# Patient Record
Sex: Male | Born: 1935 | Race: White | Hispanic: No | Marital: Married | Smoking: Former smoker
Health system: Southern US, Community
[De-identification: ages and names within clinical notes are randomized; demographics above are authoritative.]

## PROBLEM LIST (undated history)

## (undated) DIAGNOSIS — Z8673 Personal history of transient ischemic attack (TIA), and cerebral infarction without residual deficits: Secondary | ICD-10-CM

## (undated) DIAGNOSIS — R29818 Other symptoms and signs involving the nervous system: Secondary | ICD-10-CM

## (undated) DIAGNOSIS — R278 Other lack of coordination: Secondary | ICD-10-CM

## (undated) DIAGNOSIS — N400 Enlarged prostate without lower urinary tract symptoms: Secondary | ICD-10-CM

## (undated) DIAGNOSIS — I10 Essential (primary) hypertension: Secondary | ICD-10-CM

## (undated) DIAGNOSIS — R29898 Other symptoms and signs involving the musculoskeletal system: Secondary | ICD-10-CM

## (undated) DIAGNOSIS — Z87442 Personal history of urinary calculi: Secondary | ICD-10-CM

## (undated) DIAGNOSIS — G629 Polyneuropathy, unspecified: Secondary | ICD-10-CM

## (undated) HISTORY — PX: OTHER SURGICAL HISTORY: SHX169

## (undated) HISTORY — PX: HERNIA REPAIR: SHX51

## (undated) HISTORY — PX: TONSILLECTOMY: SUR1361

---

## 2004-09-11 ENCOUNTER — Inpatient Hospital Stay: Payer: Self-pay | Admitting: Internal Medicine

## 2007-05-22 ENCOUNTER — Ambulatory Visit: Payer: Self-pay | Admitting: Internal Medicine

## 2007-05-27 ENCOUNTER — Ambulatory Visit: Payer: Self-pay | Admitting: Internal Medicine

## 2016-10-30 ENCOUNTER — Other Ambulatory Visit: Payer: Self-pay | Admitting: Internal Medicine

## 2016-10-30 DIAGNOSIS — M545 Low back pain: Secondary | ICD-10-CM

## 2016-11-28 ENCOUNTER — Ambulatory Visit
Admission: RE | Admit: 2016-11-28 | Discharge: 2016-11-28 | Disposition: A | Discharge status: Home / Self Care | Payer: Medicare Other | Source: Ambulatory Visit | Attending: Internal Medicine | Admitting: Internal Medicine

## 2016-11-28 DIAGNOSIS — M47896 Other spondylosis, lumbar region: Secondary | ICD-10-CM | POA: Insufficient documentation

## 2016-11-28 DIAGNOSIS — M48061 Spinal stenosis, lumbar region without neurogenic claudication: Secondary | ICD-10-CM | POA: Diagnosis not present

## 2016-11-28 DIAGNOSIS — I10 Essential (primary) hypertension: Secondary | ICD-10-CM | POA: Insufficient documentation

## 2016-11-28 DIAGNOSIS — N401 Enlarged prostate with lower urinary tract symptoms: Secondary | ICD-10-CM | POA: Diagnosis not present

## 2016-11-28 DIAGNOSIS — M545 Low back pain: Secondary | ICD-10-CM | POA: Diagnosis present

## 2016-11-28 DIAGNOSIS — R2681 Unsteadiness on feet: Secondary | ICD-10-CM | POA: Diagnosis not present

## 2016-11-28 DIAGNOSIS — M5126 Other intervertebral disc displacement, lumbar region: Secondary | ICD-10-CM | POA: Diagnosis not present

## 2016-11-28 DIAGNOSIS — G8929 Other chronic pain: Secondary | ICD-10-CM | POA: Diagnosis not present

## 2016-11-28 MED ORDER — GADOBENATE DIMEGLUMINE 529 MG/ML IV SOLN
15.0000 mL | Freq: Once | INTRAVENOUS | Status: AC | PRN
  Administered 2016-11-28: 15 mL via INTRAVENOUS

## 2018-04-13 IMAGING — MR MR LUMBAR SPINE WO/W CM
4 of 7 series · 14 of 48 positions shown · IV contrast (15ML MULTIHANCE)
Comparison: CT of the abdomen and pelvis 05/27/2007

CLINICAL DATA: Low back pain extending into the hips and buttocks
bilaterally. Low back pain, unspecified back pain laterality,
unspecified chronicity, with sciatica presence unspecified.

EXAM:
MRI LUMBAR SPINE WITHOUT AND WITH CONTRAST
TECHNIQUE: Multiplanar and multiecho pulse sequences of the lumbar spine were
obtained without and with intravenous contrast.
CONTRAST:  15mL MULTIHANCE GADOBENATE DIMEGLUMINE 529 MG/ML IV SOLN

[Series 2: T2 · sagittal · 4.0mm · 0.44mm/px · 5 of 19 slices shown (1 of 2)]
[im 1/19]
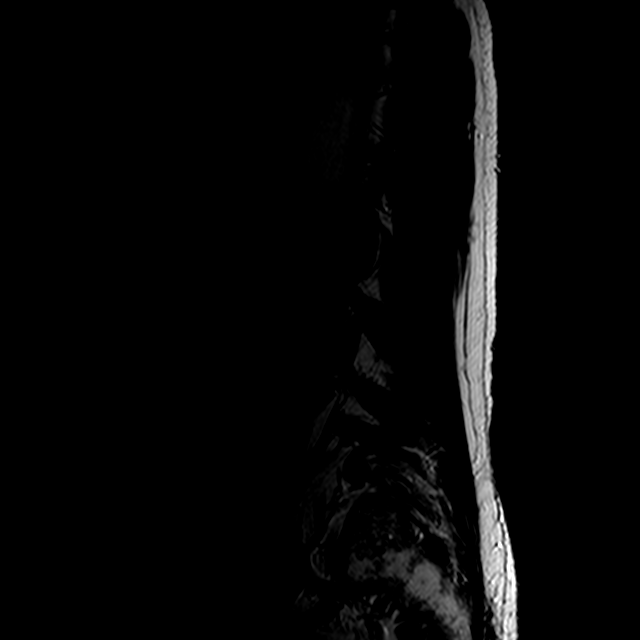
[im 4/19]
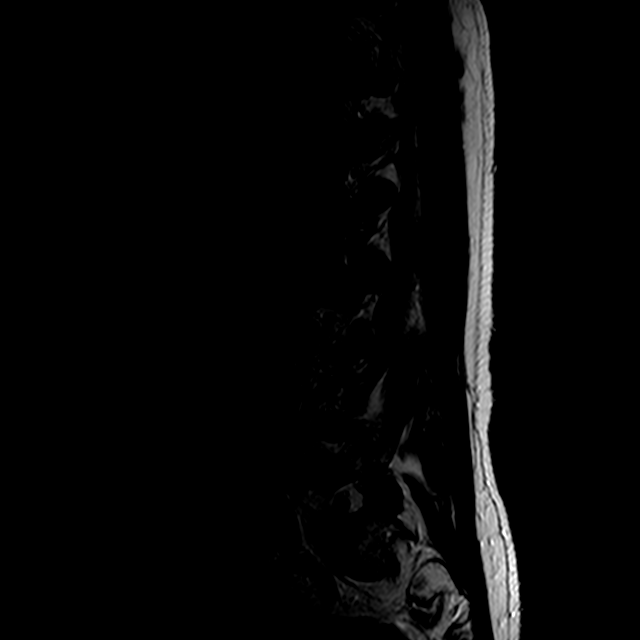
[im 8/19]
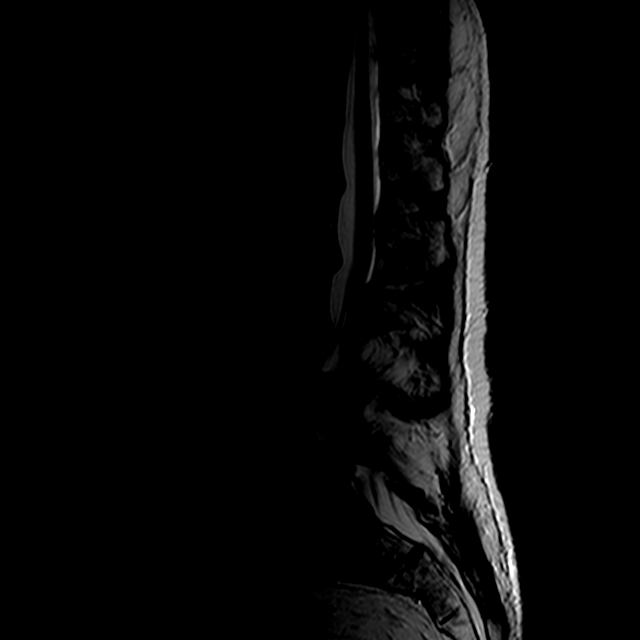
[im 11/19]
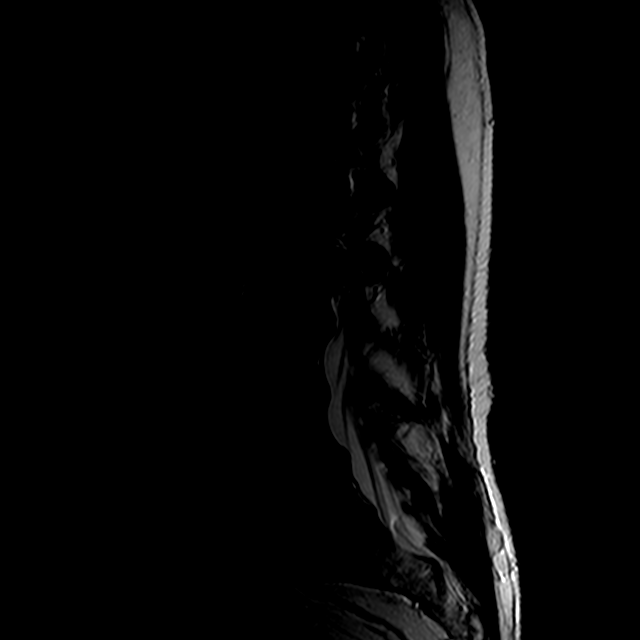
[im 19/19]
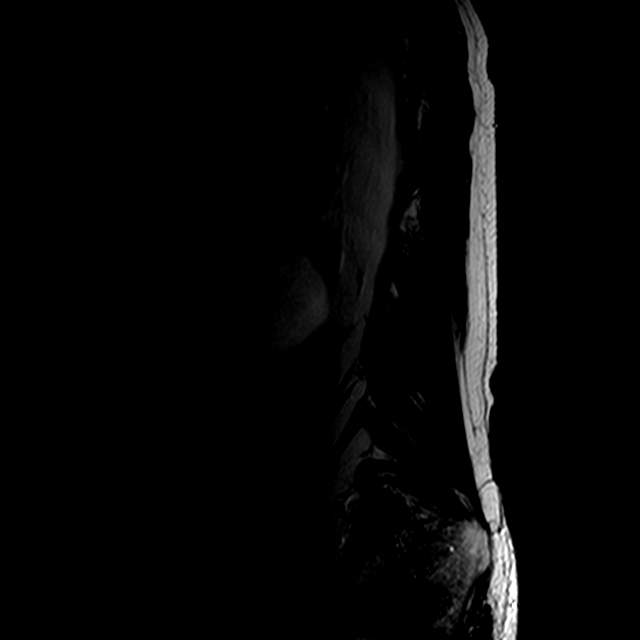

[Series 3: T1 · sagittal · 4.0mm · 0.44mm/px · 3 of 19 slices shown (1 of 2)]
[im 1/19]
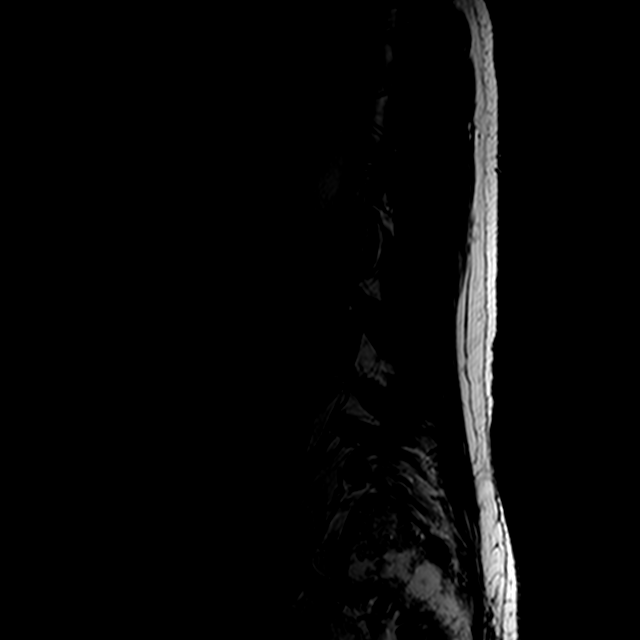
[im 10/19]
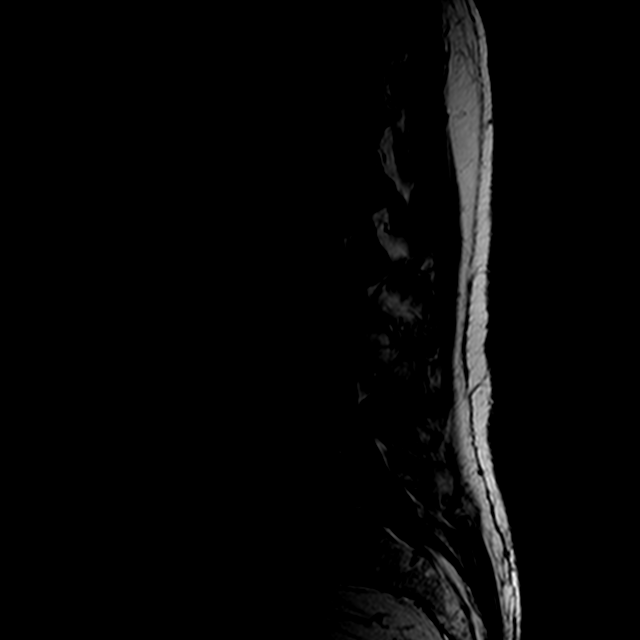
[im 19/19]
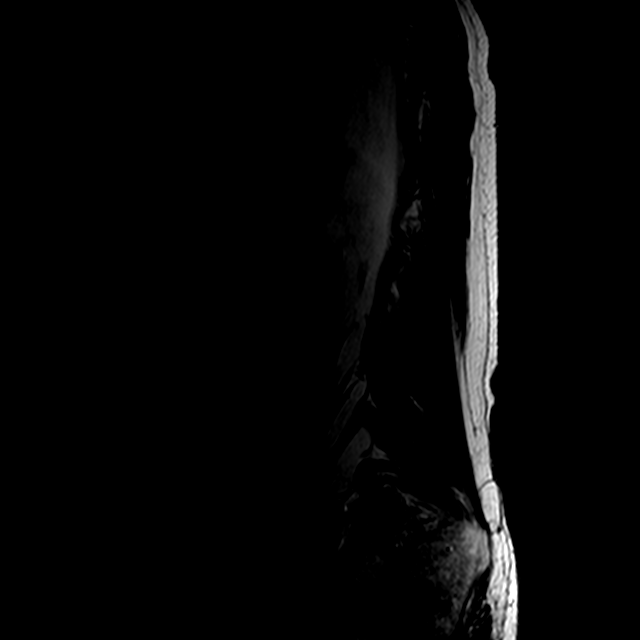

[Series 5: T2 · axial · 4.0mm · 0.39mm/px · z∈[-76,+80]mm · 3 of 36 slices shown (2 of 2)]
[im 5/36]
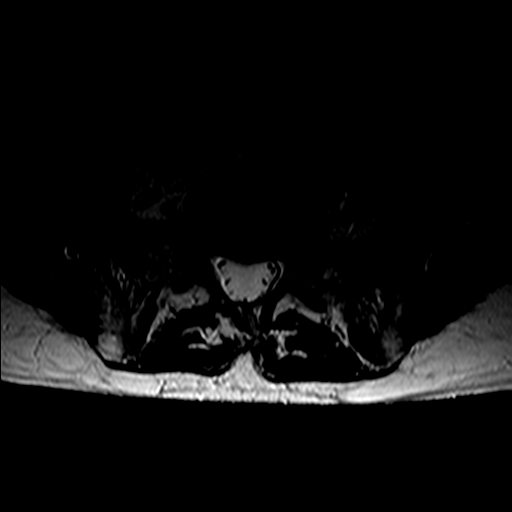
[im 18/36]
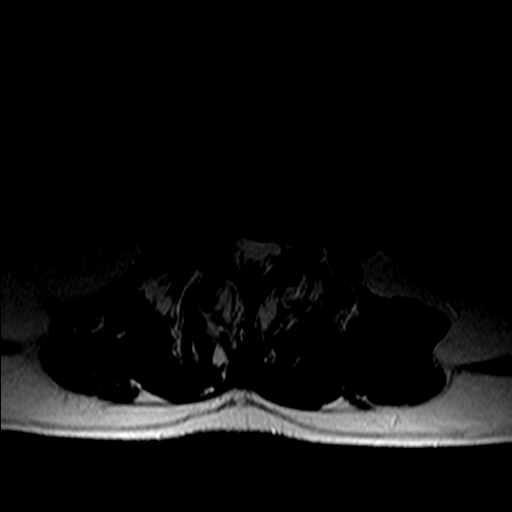
[im 31/36]
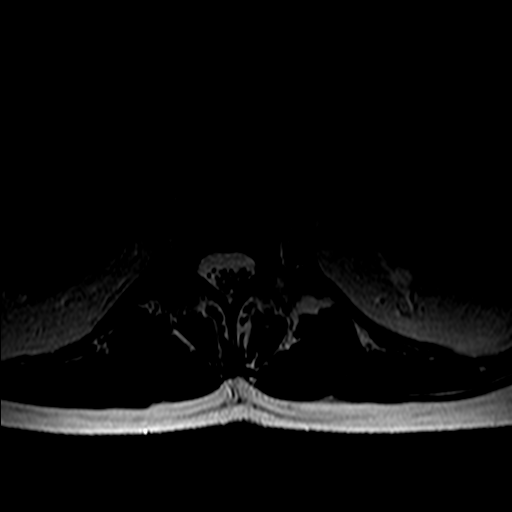

[Series 6: T1 · axial · 4.0mm · 0.39mm/px · z∈[-76,+80]mm · 3 of 36 slices shown (2 of 2)]
[im 5/36]
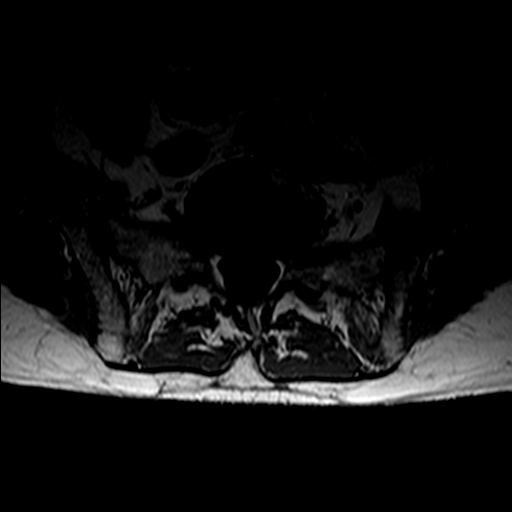
[im 18/36]
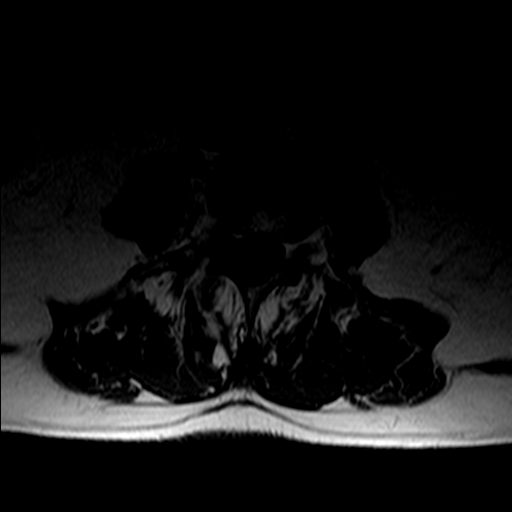
[im 31/36]
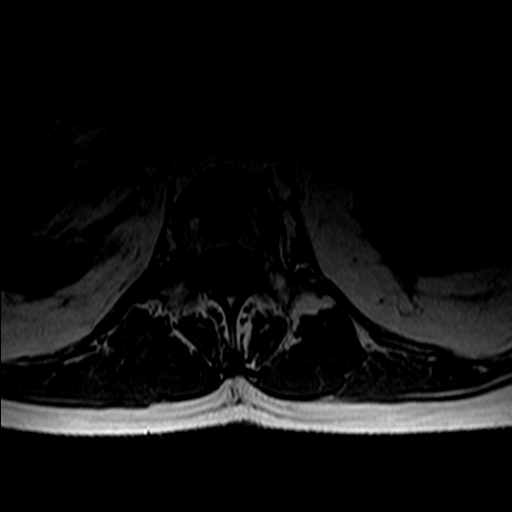

[14 of 48 positions shown; findings below may reference images not displayed]

FINDINGS: Segmentation: 5 non rib-bearing lumbar type vertebral bodies are
present.

Alignment: Slight retrolisthesis is present at L1-2 at L2-3. Minimal
anterolisthesis is present at L4-5. Levoconvex curvature of the
lumbar spine is centered at L3-4. Rightward curvature is present at
L1.

Vertebrae: Chronic endplate marrow changes are present on the left
at L1-2 and on the right at L2-3. Endplate marrow changes are
present on the right at L3-4 and L4-5. Marrow signal is somewhat
heterogeneous throughout. There is some enhancement of the endplates
at L2-3, or acute/ subacute inflammatory change without evidence for
infection.

Conus medullaris: Extends to the T12-L1 level and appears normal.

Paraspinal and other soft tissues: The left renal cysts and a extra
renal collecting system are again noted. Dilation of the extrarenal
collecting system is increased. No other focal parenchymal lesions
are present. There is no significant adenopathy.

Disc levels:

T12-L1:  Leftward disc bulging is present without focal stenosis.

L1-2: A leftward disc protrusion is present. Mild facet hypertrophy
is worse on the left. Mild left subarticular and foraminal narrowing
is present.

L2-3: A broad-based disc protrusion is present. Mild facet
hypertrophy is noted bilaterally. Mild subarticular and bilateral
foraminal stenosis is present.

L3-4: A broad-based disc protrusion is present. Asymmetric
right-sided facet hypertrophy and spurring is noted. Moderate right
and mild left subarticular and foraminal stenosis is evident.

L4-5: A rightward disc protrusion is present. Asymmetric right-sided
facet hypertrophy and spurring is noted. Moderate right and mild
left subarticular and foraminal narrowing is present.

L5-S1: A shallow central disc bulge is present without significant
stenosis.
IMPRESSION: 1. Multilevel spondylosis of the lumbar spine and levoconvex
curvature, centered at L3-4.
2. Mild left subarticular and foraminal narrowing at L1-2.
3. Mild subarticular and foraminal stenosis bilaterally at L2-3.
Endplate changes are most acute at L2-3, right greater than left.
4. Moderate right and mild left subarticular and foraminal stenosis
at L3-4 and L4-5 secondary to rightward disc protrusions and
asymmetric facet hypertrophy and spurring.

## 2020-03-17 ENCOUNTER — Other Ambulatory Visit: Payer: Self-pay | Admitting: Urology

## 2020-03-17 DIAGNOSIS — R972 Elevated prostate specific antigen [PSA]: Secondary | ICD-10-CM

## 2020-04-03 ENCOUNTER — Ambulatory Visit
Admission: RE | Admit: 2020-04-03 | Discharge: 2020-04-03 | Disposition: A | Discharge status: Home / Self Care | Payer: Medicare Other | Source: Ambulatory Visit | Attending: Urology | Admitting: Urology

## 2020-04-03 ENCOUNTER — Other Ambulatory Visit: Payer: Self-pay

## 2020-04-03 DIAGNOSIS — R972 Elevated prostate specific antigen [PSA]: Secondary | ICD-10-CM | POA: Diagnosis present

## 2020-04-03 MED ORDER — GADOBUTROL 1 MMOL/ML IV SOLN
7.0000 mL | Freq: Once | INTRAVENOUS | Status: AC | PRN
  Administered 2020-04-03: 7 mL via INTRAVENOUS

## 2020-04-16 NOTE — H&P (Signed)
NAME: James Herrera, James Herrera MEDICAL RECORD IT:[id number] ACCOUNT [id number] DATE OF BIRTH:1933/12/21 FACILITY: [facility] LOCATION: [facility] PHYSICIAN:Seth Friedlander Farrel Conners, MD  HISTORY AND PHYSICAL  DATE OF ADMISSION:  04/22/2020  CHIEF COMPLAINT:  Elevated PSA.  HISTORY OF PRESENT ILLNESS:  The patient is an 86 year old white male with long history of BPH and LUTS that is currently being managed with finasteride.  His PSA level increased up to 6.9 ng/mL on 09/01 prompting MRI scan.  Prostate MRI scan report  dated 10/01indicated a BI-RADS category 4 lesion involving the right base.  He comes in now for UroNav fusion biopsy of the prostate.  Prostate volume at the time of the MRI was 16 mL.  The patient did have a negative biopsy back in 2011.  PAST MEDICAL HISTORY:    ALLERGIES:  No drug allergies.  CURRENT MEDICATIONS:  Included olmesartan, amlodipine, metoprolol, finasteride, multivitamins, vitamin C, Caltrate with D, glucosamine chondroitin, vitamin B12, potassium, aspirin, fish oil, zinc and Allergy Relief.  PAST SURGICAL HISTORY: 1.  Tonsillectomy in 1940s. 2.  Umbilical herniorrhaphy in 1967. 3.  Right inguinal herniorrhaphy in 1979. 4.  Left inguinal herniorrhaphy in 1980. 5.  Left kidney stone basket extraction in 1987. 6.  Ureteroscopic stone extraction in 2003.  PAST AND CURRENT MEDICAL CONDITIONS:    1.  Hypertension. 2.  BPH.  REVIEW OF SYSTEMS:  The patient denies chest pain, shortness of breath, diabetes, stroke or heart disease.  SOCIAL HISTORY:  The patient is a retired Emergency planning/management officer.  He denied tobacco or alcohol use.  FAMILY HISTORY:  Positive for hypertension.  FAMILY HISTORY:  Negative for prostate cancer.  PHYSICAL EXAMINATION: VITAL SIGNS:  Height 5 feet 4, weight 154. GENERAL:  Well-nourished, white male in no acute distress. HEENT:  Sclerae were clear.  Pupils are equally round, reactive to light and accommodation.  Extraocular movements were  intact. NECK:  No palpable cervical adenopathy. PULMONARY:  Lungs clear to auscultation. CARDIOVASCULAR:  Regular rhythm and rate without audible murmurs. ABDOMEN:  Soft, nontender abdomen. GENITOURINARY:  Uncircumcised.  Testes were smooth, nontender, 20 mL size each. RECTAL:  40 g prostate with TURP defect and firm rim. NEUROMUSCULAR:  Alert and oriented x3.  IMPRESSION: 1.  Elevated PSA. 2.  BPH. 3.  Abnormal prostate MRI.  PLAN:  UroNav fusion biopsy of the prostate.  CN/NUANCE  D:04/15/2020 T:04/15/2020 JOB:[id number]

## 2020-04-19 ENCOUNTER — Other Ambulatory Visit: Payer: Self-pay

## 2020-04-19 ENCOUNTER — Encounter
Admission: RE | Admit: 2020-04-19 | Discharge: 2020-04-19 | Disposition: A | Discharge status: Home / Self Care | Payer: Medicare Other | Source: Ambulatory Visit | Attending: Urology | Admitting: Urology

## 2020-04-19 HISTORY — DX: Essential (primary) hypertension: I10

## 2020-04-19 HISTORY — DX: Personal history of urinary calculi: Z87.442

## 2020-04-19 HISTORY — DX: Benign prostatic hyperplasia without lower urinary tract symptoms: N40.0

## 2020-04-19 NOTE — Patient Instructions (Signed)
Your procedure is scheduled on: April 22, 2020  Thursday  Report to Day Surgery on the 2nd floor of the [facility]. To find out your arrival time, please call [phone] between 1PM - 3PM on: April 21, 2020 [facility]  REMEMBER: Instructions that are not followed completely may result in serious medical risk, up to and including death; or upon the discretion of your surgeon and anesthesiologist your surgery may need to be rescheduled.  Do not eat food after midnight the night before surgery.  No gum chewing, lozengers or hard candies.  You may however, drink CLEAR liquids up to 2 hours before you are scheduled to arrive for your surgery. Do not drink anything within 2 hours of your scheduled arrival time.  Clear liquids include: - water  - apple juice without pulp - gatorade (not RED) - black coffee or tea (Do NOT add milk or creamers to the coffee or tea) Do NOT drink anything that is not on this list.  Type 1 and Type 2 diabetics should only drink water.  TAKE THESE MEDICATIONS THE MORNING OF SURGERY WITH A SIP OF WATER: METOPROLOL  One week prior to surgery: Stop Anti-inflammatories (NSAIDS) such as Advil, Aleve, Ibuprofen, Motrin, Naproxen, Naprosyn and ASPIRIN OR Aspirin based products such as Excedrin, Goodys Powder, BC Powder. STOP OMEGA -3 Stop ANY OVER THE COUNTER supplements until after surgery. (You may continue taking Tylenol, Vitamin D, Vitamin B, and multivitamin.)  No Alcohol for 24 hours before or after surgery.  No Smoking including e-cigarettes for 24 hours prior to surgery.  No chewable tobacco products for at least 6 hours prior to surgery.  No nicotine patches on the day of surgery.  Do not use any "recreational" drugs for at least a week prior to your surgery.  Please be advised that the combination of cocaine and anesthesia may have negative outcomes, up to and including death. If you test positive for cocaine, your surgery will be cancelled.  On  the morning of surgery brush your teeth with toothpaste and water, you may rinse your mouth with mouthwash if you wish. Do not swallow any toothpaste or mouthwash.  Do not wear jewelry, make-up, hairpins, clips or nail polish.  Do not wear lotions, powders, or perfumes AFTERSHAVE OR DEODORANT   Do not shave 48 hours prior to surgery.   Contact lenses, hearing aids and dentures may not be worn into surgery.  Do not bring valuables to the hospital. [facility] is not responsible for any missing/lost belongings or valuables.   TAKE A SHOWER MORNING OF SURGERY.  Fleets enema  as directed.  Notify your doctor if there is any change in your medical condition (cold, fever, infection).  Wear comfortable clothing (specific to your surgery type) to the hospital.  Plan for stool softeners for home use; pain medications have a tendency to cause constipation. You can also help prevent constipation by eating foods high in fiber such as fruits and vegetables and drinking plenty of fluids as your diet allows.  After surgery, you can help prevent lung complications by doing breathing exercises.  Take deep breaths and cough every 1-2 hours. Your doctor may order a device called an Incentive Spirometer to help you take deep breaths. When coughing or sneezing, hold a pillow firmly against your incision with both hands. This is called "splinting." Doing this helps protect your incision. It also decreases belly discomfort.  If you are being discharged the day of surgery, you will not be allowed  to drive home. You will need a responsible adult (18 years or older) to drive you home and stay with you that night.   Please call the [place] Dept. at [phone] if you have any questions about these instructions.  Visitation Policy:  Patients undergoing a surgery or procedure may have one family member or support person with them as long as that person is not COVID-19 positive or experiencing  its symptoms.  That person may remain in the waiting area during the procedure.  Inpatient Visitation Update:   In an effort to ensure the safety of our team members and our patients, we are implementing a change to our visitation policy:  Effective Monday, Aug. 9, at 7 a.m., inpatients will be allowed one support person.  o The support person may change daily.  o The support person must pass our screening, gel in and out, and wear a mask at all times, including in the patient's room.  o Patients must also wear a mask when staff or their support person are in the room.  o Masking is required regardless of vaccination status.  Systemwide, no visitors 17 or younger.

## 2020-04-20 ENCOUNTER — Encounter
Admission: RE | Admit: 2020-04-20 | Discharge: 2020-04-20 | Disposition: A | Discharge status: Home / Self Care | Payer: Medicare Other | Source: Ambulatory Visit | Attending: Urology | Admitting: Urology

## 2020-04-20 DIAGNOSIS — I10 Essential (primary) hypertension: Secondary | ICD-10-CM | POA: Diagnosis not present

## 2020-04-20 DIAGNOSIS — Z20822 Contact with and (suspected) exposure to covid-19: Secondary | ICD-10-CM | POA: Insufficient documentation

## 2020-04-20 DIAGNOSIS — Z01812 Encounter for preprocedural laboratory examination: Secondary | ICD-10-CM | POA: Insufficient documentation

## 2020-04-20 DIAGNOSIS — Z01810 Encounter for preprocedural cardiovascular examination: Secondary | ICD-10-CM | POA: Insufficient documentation

## 2020-04-20 LAB — POTASSIUM: Potassium: 3.4 mmol/L — ABNORMAL LOW (ref 3.5–5.1)

## 2020-04-21 LAB — SARS CORONAVIRUS 2 (TAT 6-24 HRS): SARS Coronavirus 2: NEGATIVE

## 2020-04-21 MED ORDER — GENTAMICIN IN SALINE 0.8-0.9 MG/ML-% IV SOLN
80.0000 mg | INTRAVENOUS | Status: AC
  Administered 2020-04-22: 80 mg via INTRAVENOUS
  Filled 2020-04-21: qty 100

## 2020-04-22 ENCOUNTER — Ambulatory Visit
Admission: RE | Admit: 2020-04-22 | Discharge: 2020-04-22 | Disposition: A | Discharge status: Home / Self Care | Payer: Medicare Other | Attending: Urology | Admitting: Urology

## 2020-04-22 ENCOUNTER — Encounter: Payer: Self-pay | Admitting: Urology

## 2020-04-22 ENCOUNTER — Other Ambulatory Visit: Payer: Self-pay

## 2020-04-22 ENCOUNTER — Ambulatory Visit: Payer: Medicare Other | Admitting: Anesthesiology

## 2020-04-22 ENCOUNTER — Encounter
Admission: RE | Disposition: A | Discharge status: Home / Self Care | Payer: Self-pay | Source: Home / Self Care | Attending: Urology

## 2020-04-22 DIAGNOSIS — Z87891 Personal history of nicotine dependence: Secondary | ICD-10-CM | POA: Insufficient documentation

## 2020-04-22 DIAGNOSIS — N401 Enlarged prostate with lower urinary tract symptoms: Secondary | ICD-10-CM | POA: Diagnosis not present

## 2020-04-22 DIAGNOSIS — I10 Essential (primary) hypertension: Secondary | ICD-10-CM | POA: Insufficient documentation

## 2020-04-22 DIAGNOSIS — Z7982 Long term (current) use of aspirin: Secondary | ICD-10-CM | POA: Diagnosis not present

## 2020-04-22 DIAGNOSIS — C61 Malignant neoplasm of prostate: Secondary | ICD-10-CM | POA: Insufficient documentation

## 2020-04-22 DIAGNOSIS — N4289 Other specified disorders of prostate: Secondary | ICD-10-CM | POA: Diagnosis present

## 2020-04-22 DIAGNOSIS — Z79899 Other long term (current) drug therapy: Secondary | ICD-10-CM | POA: Insufficient documentation

## 2020-04-22 HISTORY — PX: PROSTATE BIOPSY: SHX241

## 2020-04-22 SURGERY — BIOPSY, PROSTATE
Anesthesia: General

## 2020-04-22 MED ORDER — CEFAZOLIN SODIUM-DEXTROSE 1-4 GM/50ML-% IV SOLN
INTRAVENOUS | Status: AC
  Filled 2020-04-22: qty 50

## 2020-04-22 MED ORDER — PHENYLEPHRINE HCL (PRESSORS) 10 MG/ML IV SOLN
INTRAVENOUS | Status: DC | PRN
  Administered 2020-04-22: 100 ug via INTRAVENOUS

## 2020-04-22 MED ORDER — ONDANSETRON HCL 4 MG/2ML IJ SOLN
INTRAMUSCULAR | Status: AC
  Filled 2020-04-22: qty 2

## 2020-04-22 MED ORDER — LEVOFLOXACIN 500 MG PO TABS: 500.0000 mg | ORAL_TABLET | Freq: Every day | ORAL | 0 refills | Status: DC

## 2020-04-22 MED ORDER — FENTANYL CITRATE (PF) 100 MCG/2ML IJ SOLN
INTRAMUSCULAR | Status: AC
  Filled 2020-04-22: qty 2

## 2020-04-22 MED ORDER — ORAL CARE MOUTH RINSE: 15.0000 mL | Freq: Once | OROMUCOSAL | Status: AC

## 2020-04-22 MED ORDER — ONDANSETRON HCL 4 MG/2ML IJ SOLN: 4.0000 mg | Freq: Once | INTRAMUSCULAR | Status: DC | PRN

## 2020-04-22 MED ORDER — ONDANSETRON HCL 4 MG/2ML IJ SOLN
INTRAMUSCULAR | Status: DC | PRN
  Administered 2020-04-22: 4 mg via INTRAVENOUS

## 2020-04-22 MED ORDER — FLEET ENEMA 7-19 GM/118ML RE ENEM: 1.0000 | ENEMA | Freq: Once | RECTAL | Status: DC

## 2020-04-22 MED ORDER — LIDOCAINE HCL (CARDIAC) PF 100 MG/5ML IV SOSY
PREFILLED_SYRINGE | INTRAVENOUS | Status: DC | PRN
  Administered 2020-04-22: 50 mg via INTRAVENOUS

## 2020-04-22 MED ORDER — FENTANYL CITRATE (PF) 100 MCG/2ML IJ SOLN: 25.0000 ug | INTRAMUSCULAR | Status: DC | PRN

## 2020-04-22 MED ORDER — LIDOCAINE HCL (PF) 2 % IJ SOLN
INTRAMUSCULAR | Status: AC
  Filled 2020-04-22: qty 5

## 2020-04-22 MED ORDER — CHLORHEXIDINE GLUCONATE 0.12 % MT SOLN: 15.0000 mL | Freq: Once | OROMUCOSAL | Status: AC

## 2020-04-22 MED ORDER — EPHEDRINE 5 MG/ML INJ
INTRAVENOUS | Status: AC
  Filled 2020-04-22: qty 10

## 2020-04-22 MED ORDER — FAMOTIDINE 20 MG PO TABS
ORAL_TABLET | ORAL | Status: AC
  Administered 2020-04-22: 20 mg via ORAL
  Filled 2020-04-22: qty 1

## 2020-04-22 MED ORDER — CEFAZOLIN SODIUM-DEXTROSE 1-4 GM/50ML-% IV SOLN
1.0000 g | Freq: Once | INTRAVENOUS | Status: AC
  Administered 2020-04-22: 1 g via INTRAVENOUS

## 2020-04-22 MED ORDER — LACTATED RINGERS IV SOLN: INTRAVENOUS | Status: DC

## 2020-04-22 MED ORDER — GLYCOPYRROLATE 0.2 MG/ML IJ SOLN
INTRAMUSCULAR | Status: AC
  Filled 2020-04-22: qty 1

## 2020-04-22 MED ORDER — FAMOTIDINE 20 MG PO TABS: 20.0000 mg | ORAL_TABLET | Freq: Once | ORAL | Status: AC

## 2020-04-22 MED ORDER — PROPOFOL 10 MG/ML IV BOLUS
INTRAVENOUS | Status: DC | PRN
  Administered 2020-04-22: 150 mg via INTRAVENOUS

## 2020-04-22 MED ORDER — GLYCOPYRROLATE 0.2 MG/ML IJ SOLN
INTRAMUSCULAR | Status: DC | PRN
  Administered 2020-04-22: .2 mg via INTRAVENOUS

## 2020-04-22 MED ORDER — FENTANYL CITRATE (PF) 100 MCG/2ML IJ SOLN
INTRAMUSCULAR | Status: DC | PRN
Start: 2020-04-22 — End: 2020-04-22
  Administered 2020-04-22 (×2): 25 ug via INTRAVENOUS
  Administered 2020-04-22: 50 ug via INTRAVENOUS

## 2020-04-22 MED ORDER — CHLORHEXIDINE GLUCONATE 0.12 % MT SOLN
OROMUCOSAL | Status: AC
  Administered 2020-04-22: 15 mL via OROMUCOSAL
  Filled 2020-04-22: qty 15

## 2020-04-22 MED ORDER — DEXAMETHASONE SODIUM PHOSPHATE 10 MG/ML IJ SOLN
INTRAMUSCULAR | Status: AC
  Filled 2020-04-22: qty 1

## 2020-04-22 MED ORDER — DEXAMETHASONE SODIUM PHOSPHATE 10 MG/ML IJ SOLN
INTRAMUSCULAR | Status: DC | PRN
  Administered 2020-04-22: 5 mg via INTRAVENOUS

## 2020-04-22 SURGICAL SUPPLY — 18 items
COVER MAYO STAND REUSABLE (DRAPES) ×3 IMPLANT
COVER TRANSDUCER ULTRASOUND (MISCELLANEOUS) ×2 IMPLANT
GLOVE BIO SURGEON STRL SZ7 (GLOVE) ×6 IMPLANT
GUIDE NDL ENDOCAV 16-18 CVR (NEEDLE) IMPLANT
GUIDE NDL URONAV ULTRASND S (MISCELLANEOUS) IMPLANT
GUIDE NEEDLE ENDOCAV 16-18 CVR (NEEDLE) IMPLANT
GUIDE NEEDLE URONAV ULTRASND S (MISCELLANEOUS) ×1 IMPLANT
INST BIOPSY MAXCORE 18GX25 (NEEDLE) ×3 IMPLANT
NDL GUIDE BIOPSY 644068 (NEEDLE) IMPLANT
NEEDLE GUIDE BIOPSY 644068 (NEEDLE) IMPLANT
PROBE URONAV BK 8808E 8818 HLD (MISCELLANEOUS) IMPLANT
STRAP SAFETY BODY (MISCELLANEOUS) ×3 IMPLANT
SURGILUBE 2OZ TUBE FLIPTOP (MISCELLANEOUS) ×3 IMPLANT
TOWEL OR 17X26 4PK STRL BLUE (TOWEL DISPOSABLE) ×3 IMPLANT
URONAV BK 8808E 8818 PROBE HLD (MISCELLANEOUS) ×3
URONAV MRI FUSION TWO PATIENTS (MISCELLANEOUS) ×2 IMPLANT
URONAV ULTRASOUND (MISCELLANEOUS) ×2 IMPLANT
URONAV ULTRASOUND NDL GUIDE S (MISCELLANEOUS) ×3

## 2020-04-22 NOTE — Transfer of Care (Signed)
Immediate Anesthesia Transfer of Care Note  Patient: James Herrera.  Procedure(s) Performed: PROSTATE BIOPSY URONAV (N/A )  Patient Location: PACU  Anesthesia Type:General  Level of Consciousness: drowsy  Airway & Oxygen Therapy: Patient Spontanous Breathing and Patient connected to face mask oxygen  Post-op Assessment: Report given to RN  Post vital signs: stable  Last Vitals:  Vitals Value Taken Time  BP    Temp    Pulse    Resp    SpO2      Last Pain:  Vitals:   04/22/20 1159  TempSrc: Temporal  PainSc: 0-No pain         Complications: No complications documented.

## 2020-04-22 NOTE — Anesthesia Preprocedure Evaluation (Signed)
Anesthesia Evaluation  Patient identified by MRN, date of birth, ID band Patient awake    Reviewed: Allergy & Precautions, NPO status , Patient's Chart, lab work & pertinent test results  History of Anesthesia Complications Negative for: history of anesthetic complications  Airway Mallampati: II       Dental   Pulmonary neg sleep apnea, neg COPD, Not current smoker, former smoker,           Cardiovascular hypertension, Pt. on medications (-) Past MI and (-) CHF (-) dysrhythmias (-) Valvular Problems/Murmurs     Neuro/Psych neg Seizures    GI/Hepatic Neg liver ROS, neg GERD  ,  Endo/Other  neg diabetes  Renal/GU negative Renal ROS     Musculoskeletal   Abdominal   Peds  Hematology   Anesthesia Other Findings   Reproductive/Obstetrics                             Anesthesia Physical Anesthesia Plan  ASA: II  Anesthesia Plan: General   Post-op Pain Management:    Induction: Intravenous  PONV Risk Score and Plan: 2 and Dexamethasone and Ondansetron  Airway Management Planned: LMA  Additional Equipment:   Intra-op Plan:   Post-operative Plan:   Informed Consent: I have reviewed the patients History and Physical, chart, labs and discussed the procedure including the risks, benefits and alternatives for the proposed anesthesia with the patient or authorized representative who has indicated his/her understanding and acceptance.       Plan Discussed with:   Anesthesia Plan Comments:         Anesthesia Quick Evaluation

## 2020-04-22 NOTE — Discharge Instructions (Signed)

## 2020-04-22 NOTE — Anesthesia Postprocedure Evaluation (Signed)
Anesthesia Post Note  Patient: James Herrera.  Procedure(s) Performed: PROSTATE BIOPSY URONAV (N/A )  Patient location during evaluation: PACU Anesthesia Type: General Level of consciousness: awake and alert Pain management: pain level controlled Vital Signs Assessment: post-procedure vital signs reviewed and stable Respiratory status: spontaneous breathing and respiratory function stable Cardiovascular status: stable Anesthetic complications: no   No complications documented.   Last Vitals:  Vitals:   04/22/20 1420 04/22/20 1435  BP: (!) 156/85 137/83  Pulse: 66 70  Resp: 15 17  Temp: 36.6 C   SpO2: 100% 96%    Last Pain:  Vitals:   04/22/20 1420  TempSrc:   PainSc: Asleep                 Chosen Garron K

## 2020-04-22 NOTE — Op Note (Signed)
Preoperative diagnosis:  1.  Elevated PSA (C61)                                              2.  PIRAD category 4 lesion right base on MRI (D40.0) Postoperative diagnosis: Same  Procedure: Uronav fusion image guided prostate biopsy (CPT [id number], [id number])  Surgeon: Otelia Limes. Yves Dill MD  Anesthesia: General  Indications:See the history and physical. After informed consent the above procedure(s) were requested     Technique and findings: After adequate general anesthesia been obtained the patient was placed into left lateral decubitus position.  Digital inspection indicated that the rectal vault was clear.  The transrectal ultrasound probe was placed and prostate images acquired.  The ultrasound images were then fused with the MRI images.  The region of interest was identified and 4 core biopsies taken from this lesion.  At this point standard 12 core systematic biopsies were performed.  The ultrasound probe was removed.  Blood loss was minimal.  Procedure was then terminated and patient transferred to the recovery room in stable condition.

## 2020-04-22 NOTE — Anesthesia Procedure Notes (Signed)
Procedure Name: LMA Insertion Date/Time: 04/22/2020 1:49 PM Performed by: Lerry Liner, CRNA Pre-anesthesia Checklist: Patient identified, Emergency Drugs available, Suction available and Patient being monitored Patient Re-evaluated:Patient Re-evaluated prior to induction Oxygen Delivery Method: Circle system utilized Preoxygenation: Pre-oxygenation with 100% oxygen Induction Type: IV induction LMA: LMA inserted LMA Size: 4.5 Number of attempts: 1 Tube secured with: Tape Dental Injury: Teeth and Oropharynx as per pre-operative assessment

## 2020-04-22 NOTE — H&P (Signed)
Date of Initial H&P: 04/15/20 History reviewed, patient examined, no change in status, stable for surgery. 

## 2020-04-23 ENCOUNTER — Encounter: Payer: Self-pay | Admitting: Urology

## 2020-04-26 LAB — SURGICAL PATHOLOGY

## 2020-06-10 ENCOUNTER — Other Ambulatory Visit: Payer: Self-pay | Admitting: Radiology

## 2020-06-10 DIAGNOSIS — C61 Malignant neoplasm of prostate: Secondary | ICD-10-CM

## 2020-06-25 ENCOUNTER — Other Ambulatory Visit: Payer: Self-pay

## 2020-06-25 ENCOUNTER — Ambulatory Visit
Admission: RE | Admit: 2020-06-25 | Discharge: 2020-06-25 | Disposition: A | Discharge status: Home / Self Care | Payer: Medicare Other | Source: Ambulatory Visit | Attending: Radiology | Admitting: Radiology

## 2020-06-25 DIAGNOSIS — C61 Malignant neoplasm of prostate: Secondary | ICD-10-CM | POA: Diagnosis not present

## 2021-05-05 ENCOUNTER — Other Ambulatory Visit: Payer: Self-pay | Admitting: Internal Medicine

## 2021-05-05 DIAGNOSIS — H5319 Other subjective visual disturbances: Secondary | ICD-10-CM

## 2021-05-05 DIAGNOSIS — R29898 Other symptoms and signs involving the musculoskeletal system: Secondary | ICD-10-CM

## 2021-05-05 DIAGNOSIS — R278 Other lack of coordination: Secondary | ICD-10-CM

## 2021-05-18 ENCOUNTER — Other Ambulatory Visit: Payer: Medicare Other

## 2021-05-20 ENCOUNTER — Ambulatory Visit
Admission: RE | Admit: 2021-05-20 | Discharge: 2021-05-20 | Disposition: A | Discharge status: Home / Self Care | Payer: Medicare Other | Source: Ambulatory Visit | Attending: Internal Medicine | Admitting: Internal Medicine

## 2021-05-20 ENCOUNTER — Other Ambulatory Visit: Payer: Self-pay

## 2021-05-20 DIAGNOSIS — R278 Other lack of coordination: Secondary | ICD-10-CM | POA: Diagnosis present

## 2021-05-20 DIAGNOSIS — R29898 Other symptoms and signs involving the musculoskeletal system: Secondary | ICD-10-CM | POA: Insufficient documentation

## 2021-05-20 DIAGNOSIS — H5319 Other subjective visual disturbances: Secondary | ICD-10-CM | POA: Insufficient documentation

## 2022-01-18 ENCOUNTER — Other Ambulatory Visit: Payer: Self-pay | Admitting: Neurology

## 2022-01-18 DIAGNOSIS — I639 Cerebral infarction, unspecified: Secondary | ICD-10-CM

## 2022-01-27 ENCOUNTER — Ambulatory Visit
Admission: RE | Admit: 2022-01-27 | Discharge: 2022-01-27 | Disposition: A | Discharge status: Home / Self Care | Payer: Medicare Other | Source: Ambulatory Visit | Attending: Neurology | Admitting: Neurology

## 2022-01-27 DIAGNOSIS — I639 Cerebral infarction, unspecified: Secondary | ICD-10-CM | POA: Insufficient documentation

## 2022-04-12 ENCOUNTER — Ambulatory Visit: Payer: Medicare Other | Attending: Neurology | Admitting: Occupational Therapy

## 2022-04-12 DIAGNOSIS — R278 Other lack of coordination: Secondary | ICD-10-CM | POA: Insufficient documentation

## 2022-04-12 DIAGNOSIS — M6281 Muscle weakness (generalized): Secondary | ICD-10-CM | POA: Insufficient documentation

## 2022-04-12 NOTE — Therapy (Signed)
OUTPATIENT OCCUPATIONAL THERAPY NEURO EVALUATION  Patient Name: James Herrera. MRN: [id number] DOB:[birth year], 88 y.o., male Today's Date: 04/12/2022  PCP: Dr. Ginette Pitman REFERRING PROVIDER: Dr. Melrose Nakayama   OT End of Session - 04/12/22 1007     Visit Number 1    Number of Visits 24    Date for OT Re-Evaluation 07/05/22    OT Start Time 1000    OT Stop Time 1100    OT Time Calculation (min) 60 min    Activity Tolerance Patient tolerated treatment well    Behavior During Therapy WFL for tasks assessed/performed             Past Medical History:  Diagnosis Date   BPH (benign prostatic hyperplasia)    History of kidney stones    Hypertension    Past Surgical History:  Procedure Laterality Date   HERNIA REPAIR Right    HERNIA REPAIR Left    HERNIA REPAIR     umbilical   kidney stone removal      x2   PROSTATE BIOPSY N/A 04/22/2020   Procedure: PROSTATE BIOPSY [name];  Surgeon: Royston Cowper, MD;  Location: [facility] ORS;  Service: Urology;  Laterality: N/A;   TONSILLECTOMY     There are no problems to display for this patient.   ONSET DATE: 01/08/2020  REFERRING DIAG: LUE weakness   THERAPY DIAG:   Left upper extremity weakness  Rationale for Evaluation and Treatment Rehabilitation  SUBJECTIVE:   SUBJECTIVE STATEMENT:  Patient is present with his wife initial evaluation  Pt accompanied by: self and significant other  PERTINENT HISTORY: Patient is an 88 year old male who was referred to OT services by Dr. 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  Patient reports left upper extremity changes occurred after receiving the COVID shot in 01/27/2020  PRECAUTIONS: None  WEIGHT BEARING RESTRICTIONS   No  PAIN:  Are you having pain? 1/10 in left thumb  FALLS: Has patient fallen in last 6 months? Yes, I fall off of a stool  LIVING ENVIRONMENT: Lives with: lives with their spouse Lives in: House/apartment Stairs: 1 story home, 2 steps from [place] Has following equipment at home: Single point cane and Grab bars  PLOF: Independent  PATIENT GOALS  To make it  to 100  OBJECTIVE:   HAND DOMINANCE: Left  ADLs: Overall ADLs:  Transfers/ambulation related to ADLs:Independent Eating:  Shakiness with utensils, some spillage, wife assists with  cutting food Grooming:  Awkward to use the left hand for brushing teeth, toothbrush turns in his mouth UB Dressing: Independent, wife assists with buttoning LB Dressing: Independent; difficulty tying shoes Toileting:  Independent Bathing:  Difficulty holding on to soap Tub Shower transfers:  Walk-in shower. Independent. Wife reports being present in the house, however does not supervise showers.  Equipment: Long handled shoe horn   IADLs: Shopping:  The transaction is a challenge Light housekeeping:  Mostly uses the right for the dishes,  Independent laundry, mowing, picking up sticks Meal Prep:  Difficulty with opening packets, packages, and zip lock bags Community mobility:  Driving Medication management: Independent using a pillbox. Difficulty grasping small pills with the left hand Financial management:  Wife performs Handwriting: 25% legible  MOBILITY STATUS: Independent  POSTURE COMMENTS:  Sitting balance: Independent  ACTIVITY TOLERANCE: Activity tolerance: Within functional limits  FUNCTIONAL OUTCOME MEASURES: FOTO: 63 TR score: 63  UPPER EXTREMITY ROM                BUEs WFL   UPPER EXTREMITY MMT:     MMT Right eval Left eval  Shoulder flexion 5/5 5/5  Shoulder abduction 5/5 5/5  Shoulder adduction    Shoulder extension    Shoulder internal rotation    Shoulder external rotation    Middle trapezius     Lower trapezius    Elbow flexion 5/5 5/5  Elbow extension 5/5 5/5  Wrist flexion    Wrist extension 5/5 5/5  Wrist ulnar deviation    Wrist radial deviation    Wrist pronation    Wrist supination    (Blank rows = not tested)  HAND FUNCTION: Grip strength: Right: 65 lbs; Left: 25 lbs Pinch strength: Lateral: R: 15, L:11, 3pt. R: 13 L: 10  COORDINATION: 9 Hole Peg test: Right: 43 sec; Left: 2 min. 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 Yes  SHORT TERM GOALS: Target date: 05/24/2022    Patient will be independent with home exercise programs for the left hand Baseline: Eval: Currently no home exercise program Goal status: INITIAL    LONG TERM GOALS: Target date: 07/05/2022    Patient will improve left grip strength by 5 pounds to be able to securely hold a bar of soap while showering. Baseline: Eval: patient has difficulty holding a bar of soap securely Goal status: INITIAL  2.  Patient will independently sign his name 75% legibility Baseline: Eval:  25% legible for signing his name Goal status: INITIAL  3.  Patient  will put on his shirt with modified independence Baseline: Eval: Patient is unable to button shirt buttons Goal status: INITIAL  4.  Patient will improve left hand fine motor coordination skills to be able to manipulate small items or pills efficiently Baseline: Eval: Patient has difficulty manipulating small objects with his dominant left hand Goal status: INITIAL  5.  Patient will improve left lateral pinch strength to be able to hold a knife in preparation for cutting food Baseline: Eval: Patient is unable to hold a knife while cutting food Goal status: INITIAL  6.  Patient will improve FOTO score by 2 points for improved patient perceived performance in assessment specific ADLs,and IADLs Baseline: Eval: FOTO 63; TR score 63 Goal status: INITIAL  ASSESSMENT:  CLINICAL IMPRESSION:  Patient is an 88 y.o. male who was seen today for occupational therapy evaluation for LUE weakness.  Patient presents with weakness in left grip strength, and fine motor coordination which affects his ability  to hold a knife securely while cutting food writing legibility by ending and manipulating small objects such as pills/medication.  Patient's FOTO score is 63 with a TR score of 63.  Patient Will benefit from OT services to work on improving left grip strength, pinch strength, coordination, and hand function skills in order to left hand function needed to maximize independence with, and complete ADL and IADL tasks efficiently.  PERFORMANCE DEFICITS in functional skills including ADLs, IADLs, coordination, dexterity, strength, pain, [employer], and [facility], cognitive skills including: attention and memory, and psychosocial skills including coping strategies, environmental adaptation, and routines and behaviors.   IMPAIRMENTS are limiting patient from ADLs, IADLs, and social participation.   COMORBIDITIES may have co-morbidities  that affects occupational performance. Patient will benefit from skilled OT to address  above impairments and improve overall function.  MODIFICATION OR ASSISTANCE TO COMPLETE EVALUATION: Min-Moderate modification of tasks or assist with assess necessary to complete an evaluation.  OT OCCUPATIONAL PROFILE AND HISTORY: Detailed assessment: Review of records and additional review of physical, cognitive, psychosocial history related to current functional performance.  CLINICAL DECISION MAKING: Moderate - several treatment options, min-mod task modification necessary  REHAB POTENTIAL: Good  EVALUATION COMPLEXITY: Moderate    PLAN: OT FREQUENCY: 2x/week  OT DURATION: 12 weeks  PLANNED INTERVENTIONS: self care/ADL training, therapeutic exercise, therapeutic activity, neuromuscular re-education, manual therapy, paraffin, and moist heat  RECOMMENDED OTHER SERVICES:   CONSULTED AND AGREED WITH PLAN OF CARE: Patient and family Midwife  PLAN FOR NEXT SESSION:  Initiate Treatment sessions   Harrel Carina, MS, OTR/L  Harrel Carina, OT 04/12/2022, 10:17 AM

## 2022-04-17 ENCOUNTER — Ambulatory Visit: Payer: Medicare Other | Admitting: Occupational Therapy

## 2022-04-17 DIAGNOSIS — R278 Other lack of coordination: Secondary | ICD-10-CM

## 2022-04-17 DIAGNOSIS — M6281 Muscle weakness (generalized): Secondary | ICD-10-CM | POA: Diagnosis not present

## 2022-04-17 NOTE — Therapy (Signed)
OUTPATIENT OCCUPATIONAL THERAPY NEURO TREATMENT NOTE  Patient Name: James Herrera. MRN: [id number] DOB:04-18-1934, 88 y.o., male Today's Date: 04/17/2022  PCP: Dr. Ginette Pitman REFERRING PROVIDER: Dr. Melrose Nakayama   OT End of Session - 04/17/22 1622     Visit Number 2    Number of Visits 24    Date for OT Re-Evaluation 07/05/22    OT Start Time 1145    OT Stop Time 1228    OT Time Calculation (min) 43 min    Activity Tolerance Patient tolerated treatment well    Behavior During Therapy WFL for tasks assessed/performed             Past Medical History:  Diagnosis Date   BPH (benign prostatic hyperplasia)    History of kidney stones    Hypertension    Past Surgical History:  Procedure Laterality Date   HERNIA REPAIR Right    HERNIA REPAIR Left    HERNIA REPAIR     umbilical   kidney stone removal      x2   PROSTATE BIOPSY N/A 04/22/2020   Procedure: PROSTATE BIOPSY [name];  Surgeon: Royston Cowper, MD;  Location: [facility] ORS;  Service: Urology;  Laterality: N/A;   TONSILLECTOMY     There are no problems to display for this patient.   ONSET DATE: 01/08/2020  REFERRING DIAG: LUE weakness   THERAPY DIAG:   Left upper extremity weakness  Rationale for Evaluation and Treatment Rehabilitation  SUBJECTIVE:   SUBJECTIVE STATEMENT:  Patient reports that he has been squeezing a football consistently for hand strengthening at home.  Pt accompanied by: self and significant other  PERTINENT HISTORY: Patient is an 88 year old male who was referred to OT services by Dr. 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  Patient reports left upper extremity changes occurred after receiving the COVID shot in  01/27/2020  PRECAUTIONS: None  WEIGHT BEARING RESTRICTIONS  No  PAIN:  Are you having pain? 1/10 in left thumb  FALLS: Has patient fallen in last 6 months? Yes, I fall off of a stool  LIVING ENVIRONMENT: Lives with: lives with their spouse Lives in: House/apartment Stairs: 1 story home, 2 steps from [place] Has following equipment at home: Single point cane and Grab bars  PLOF: Independent  PATIENT GOALS  To make it  to 100  OBJECTIVE:   HAND DOMINANCE: Left  ADLs: Overall ADLs:  Transfers/ambulation related to ADLs:Independent Eating:  Shakiness with utensils, some spillage, wife assists with  cutting food Grooming:  Awkward to use the left hand for brushing teeth, toothbrush turns in his mouth UB Dressing: Independent, wife assists with buttoning LB Dressing: Independent; difficulty tying shoes Toileting:  Independent Bathing:  Difficulty holding on to soap Tub Shower transfers:  Walk-in shower. Independent. Wife reports being present in the house, however does not supervise showers.  Equipment: Long handled shoe horn   IADLs: Shopping:  The transaction is a challenge Light housekeeping:  Mostly uses the right for the dishes,  Independent laundry, mowing, picking up sticks Meal Prep:  Difficulty with opening packets, packages, and zip lock bags Community mobility:  Driving Medication management: Independent using a pillbox. Difficulty grasping small pills with the left hand Financial management:  Wife performs Handwriting: 25% legible  MOBILITY STATUS: Independent  POSTURE COMMENTS:   Sitting balance: Independent  ACTIVITY TOLERANCE: Activity tolerance: Within functional limits  FUNCTIONAL OUTCOME MEASURES: FOTO: 63 TR score: 63  UPPER EXTREMITY ROM                BUEs WFL   UPPER EXTREMITY MMT:     MMT Right eval Left eval  Shoulder flexion 5/5 5/5  Shoulder abduction 5/5 5/5  Shoulder adduction    Shoulder extension    Shoulder internal  rotation    Shoulder external rotation    Middle trapezius    Lower trapezius    Elbow flexion 5/5 5/5  Elbow extension 5/5 5/5  Wrist flexion    Wrist extension 5/5 5/5  Wrist ulnar deviation    Wrist radial deviation    Wrist pronation    Wrist supination    (Blank rows = not tested)  HAND FUNCTION: Grip strength: Right: 65 lbs; Left: 25 lbs Pinch strength: Lateral: R: 15, L:11, 3pt. R: 13 L: 10  COORDINATION: 9 Hole Peg test: Right: 43 sec; Left: 2 min. 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 worked on green thearputty ex. for left hand strengthening. Exercises including: gross gripping, ateral, and 3pt. Pinch strengthening, and thumb opposition. Pt. Was provided with a visual handout HEP through [employer]. Pt. performed left hand gross gripping with a gross grip strengthener. Pt. worked on sustaining grip while grasping pegs and reaching at various heights. The gripper was set to 17.9 # of grip strength resistance. Pt. Worked on pinch strengthening in the left hand for lateral, and 3pt. pinch using yellow, red, and green resistive clips. Pt. worked on placing the clips at various vertical and horizontal angles. Tactile and verbal cues were required for eliciting the desired movement.  Patient attempted to perform transitory movements moving the clips from two-point pinch to three-point pinch position.    PATIENT EDUCATION: Education details: OT services, plan of care, goals Person educated: Patient and Spouse Education  method: Explanation, Demonstration, and Tactile cues Verbal cues Education comprehension: verbalized understanding and returned demonstration   HOME EXERCISE PROGRAM:             [employer] green Theraputty exercise program for left hand strengthening    GOALS: Goals reviewed with patient? Yes  SHORT TERM GOALS: Target date: 05/24/2022    Patient will be independent with home exercise programs for the left hand Baseline: Eval: Currently no home exercise program Goal status: INITIAL    LONG TERM GOALS: Target date: 07/05/2022    Patient will improve left grip strength by 5 pounds to be able to securely hold a bar of soap while showering. Baseline: Eval: patient has difficulty holding a bar of soap securely Goal status: INITIAL  2.  Patient will independently sign his name 75% legibility Baseline: Eval:  25% legible for signing his name Goal status: INITIAL  3.  Patient will put on his shirt with modified independence Baseline: Eval: Patient is unable to button shirt buttons Goal status: INITIAL  4.  Patient will improve  left hand fine motor coordination skills to be able to manipulate small items or pills efficiently Baseline: Eval: Patient has difficulty manipulating small objects with his dominant left hand Goal status: INITIAL  5.  Patient will improve left lateral pinch strength to be able to hold a knife in preparation for cutting food Baseline: Eval: Patient is unable to hold a knife while cutting food Goal status: INITIAL  6.  Patient will improve FOTO score by 2 points for improved patient perceived performance in assessment specific ADLs,and IADLs Baseline: Eval: FOTO 63; TR score 63 Goal status: INITIAL  ASSESSMENT:  CLINICAL IMPRESSION:  Patient tolerated the green level of resistance for Theraputty exercises, and was able to demonstrate proper technique of each exercise for the home exercise program.  Patient was able to perform gross gripping and pinch  strengthening exercises with cues.  Patient had difficulty performing transitory movements moving the clips from the two-point pinch to lateral pinch position without engaging his right hand to assist in the task. Patient continues to benefit from OT services to work on improving left grip strength, pinch strength, coordination, and hand function skills in order to improve left hand functioning needed to maximize independence with, and complete ADL and IADL tasks efficiently.  PERFORMANCE DEFICITS in functional skills including ADLs, IADLs, coordination, dexterity, strength, pain, [place], and GMC, cognitive skills including: attention and memory, and psychosocial skills including coping strategies, environmental adaptation, and routines and behaviors.   IMPAIRMENTS are limiting patient from ADLs, IADLs, and social participation.   COMORBIDITIES may have co-morbidities  that affects occupational performance. Patient will benefit from skilled OT to address above impairments and improve overall function.  MODIFICATION OR ASSISTANCE TO COMPLETE EVALUATION: Min-Moderate modification of tasks or assist with assess necessary to complete an evaluation.  OT OCCUPATIONAL PROFILE AND HISTORY: Detailed assessment: Review of records and additional review of physical, cognitive, psychosocial history related to current functional performance.  CLINICAL DECISION MAKING: Moderate - several treatment options, min-mod task modification necessary  REHAB POTENTIAL: Good  EVALUATION COMPLEXITY: Moderate    PLAN: OT FREQUENCY: 2x/week  OT DURATION: 12 weeks  PLANNED INTERVENTIONS: self care/ADL training, therapeutic exercise, therapeutic activity, neuromuscular re-education, manual therapy, paraffin, and moist heat  RECOMMENDED OTHER SERVICES:   CONSULTED AND AGREED WITH PLAN OF CARE: Patient and family Midwife  PLAN FOR NEXT SESSION:  Initiate Treatment sessions   Harrel Carina, MS, OTR/L   Harrel Carina, OT 04/17/2022, 4:24 PM

## 2022-04-19 ENCOUNTER — Encounter: Payer: Self-pay | Admitting: Occupational Therapy

## 2022-04-19 ENCOUNTER — Ambulatory Visit: Payer: Medicare Other | Admitting: Occupational Therapy

## 2022-04-19 DIAGNOSIS — M6281 Muscle weakness (generalized): Secondary | ICD-10-CM

## 2022-04-19 NOTE — Therapy (Addendum)
OUTPATIENT OCCUPATIONAL THERAPY NEURO TREATMENT NOTE  Patient Name: James Herrera. MRN: [id number] DOB:[birth year], 88 y.o., male Today's Date: 04/19/2022  PCP: Dr. Ginette Pitman REFERRING PROVIDER: Dr. Melrose Nakayama   OT End of Session - 04/19/22 1658     Visit Number 3    Number of Visits 24    Date for OT Re-Evaluation 07/05/22    OT Start Time 1305    OT Stop Time 1345    OT Time Calculation (min) 40 min    Activity Tolerance Patient tolerated treatment well    Behavior During Therapy WFL for tasks assessed/performed             Past Medical History:  Diagnosis Date   BPH (benign prostatic hyperplasia)    History of kidney stones    Hypertension    Past Surgical History:  Procedure Laterality Date   HERNIA REPAIR Right    HERNIA REPAIR Left    HERNIA REPAIR     umbilical   kidney stone removal      x2   PROSTATE BIOPSY N/A 04/22/2020   Procedure: PROSTATE BIOPSY [name];  Surgeon: Royston Cowper, MD;  Location: [facility] OR[facility];  Service: Urology;  Laterality: N/A;   TONSILLECTOMY     There are no problems to display for this patient.   ONSET DATE: 01/08/2020  REFERRING DIAG: LUE weakness   THERAPY DIAG:   Left upper extremity weakness  Rationale for Evaluation and Treatment Rehabilitation  SUBJECTIVE:   SUBJECTIVE STATEMENT:  Patient reports that he has been squeezing a football consistently for hand strengthening at home.  Pt accompanied by: self and significant other  PERTINENT HISTORY: Patient is an 88 year old male who was referred to OT services by Dr. 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  Patient reports left upper extremity changes occurred after receiving the COVID shot in  01/27/2020  PRECAUTIONS: None  WEIGHT BEARING RESTRICTIONS  No  PAIN:  Are you having pain? No  FALLS: Has patient fallen in last 6 months? Yes, I fall off of a stool  LIVING ENVIRONMENT: Lives with: lives with their spouse Lives in: House/apartment Stairs: 1 story home, 2 steps from [place] Has following equipment at home: Single point cane and Grab bars  PLOF: Independent  PATIENT GOALS  To make it  to 100  OBJECTIVE:   HAND DOMINANCE: Left  ADLs: Overall ADLs:  Transfers/ambulation related to ADLs:Independent Eating:  Shakiness with utensils, some spillage, wife assists with  cutting food Grooming:  Awkward to use the left hand for brushing teeth, toothbrush turns in his mouth UB Dressing: Independent, wife assists with buttoning LB Dressing: Independent; difficulty tying shoes Toileting:  Independent Bathing:  Difficulty holding on to soap Tub Shower transfers:  Walk-in shower. Independent. Wife reports being present in the house, however does not supervise showers.  Equipment: Long handled shoe horn   IADLs: Shopping:  The transaction is a challenge Light housekeeping:  Mostly uses the right for the dishes,  Independent laundry, mowing, picking up sticks Meal Prep:  Difficulty with opening packets, packages, and zip lock bags Community mobility:  Driving Medication management: Independent using a pillbox. Difficulty grasping small pills with the left hand Financial management:  Wife performs Handwriting: 25% legible  MOBILITY STATUS:  Independent  POSTURE COMMENTS:   Sitting balance: Independent  ACTIVITY TOLERANCE: Activity tolerance: Within functional limits  FUNCTIONAL OUTCOME MEASURES: FOTO: 63 TR score: 63  UPPER EXTREMITY ROM                BUEs WFL   UPPER EXTREMITY MMT:     MMT Right eval Left eval  Shoulder flexion 5/5 5/5  Shoulder abduction 5/5 5/5  Shoulder adduction    Shoulder extension    Shoulder internal rotation     Shoulder external rotation    Middle trapezius    Lower trapezius    Elbow flexion 5/5 5/5  Elbow extension 5/5 5/5  Wrist flexion    Wrist extension 5/5 5/5  Wrist ulnar deviation    Wrist radial deviation    Wrist pronation    Wrist supination    (Blank rows = not tested)  HAND FUNCTION: Grip strength: Right: 65 lbs; Left: 25 lbs Pinch strength: Lateral: R: 15, L:11, 3pt. R: 13 L: 10  COORDINATION: 9 Hole Peg test: Right: 43 sec; Left: 2 min. 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  Patient worked on Counselling psychologist, with his left hand and storing them in the palm of his hand.  Patient had difficulty bringing the marbles from the tips of his fingers to his palm for storage.  Patient then transitioned to attempting with larger checkers.  Patient worked on moving a 1 inch foam roll still to proximal through his hand with his digits.  Patient then transitioned performing the task with a 1" sticky back ball for more security of the item in his hand.  Pt. performed  left hand [place] tasks using the Grooved pegboard. Pt. worked on grasping the grooved pegs from a horizontal position, and moving the pegs to a vertical position in the hand to prepare for placing them in the grooved slot.  Patient worked on removing the pegs alternating thumb opposition to the tip of his second through fifth digits.  PATIENT EDUCATION: Education details: OT services, plan of care,  goals Person educated: Patient and Spouse Education method: Explanation, Demonstration, and Tactile cues Verbal cues Education comprehension: verbalized understanding and returned demonstration   HOME EXERCISE PROGRAM:             [employer] green Theraputty exercise program for left hand strengthening    GOALS: Goals reviewed with patient? Yes  SHORT TERM GOALS: Target date: 05/24/2022    Patient will be independent with home exercise programs for the left hand Baseline: Eval: Currently no home exercise program Goal status: INITIAL    LONG TERM GOALS: Target date: 07/05/2022    Patient will improve left grip strength by 5 pounds to be able to securely hold a bar of soap while showering. Baseline: Eval: patient has difficulty holding a bar of soap securely Goal status: INITIAL  2.  Patient will independently sign his name 75% legibility Baseline: Eval:  25% legible for signing his name Goal status: INITIAL  3.  Patient will put on his shirt with modified independence Baseline: Eval: Patient is unable to button shirt buttons Goal status: INITIAL  4.  Patient will improve left hand fine motor coordination skills to be able to manipulate small items or pills efficiently Baseline: Eval: Patient has difficulty manipulating small objects with his dominant left hand Goal status: INITIAL  5.  Patient will improve left lateral pinch strength to be able to hold a knife in preparation for cutting food Baseline: Eval: Patient is unable to hold a knife while cutting food Goal status: INITIAL  6.  Patient will improve FOTO score by 2 points for improved patient perceived performance in assessment specific ADLs,and IADLs Baseline: Eval: FOTO 63; TR score 63 Goal status: INITIAL  ASSESSMENT:  CLINICAL IMPRESSION:  Patient reports having had some hand soreness  in his left hand soreness following hand strengthening exercises this week. Patient reports no pain but felt like the  muscles in his hand had been worked.  Patient presents with difficulty performing transitory movements with the left hand.  Patient had difficulty moving pinch flat marbles through his hand. Pt. Was able to store flat marbles in his hand, however was unable to move them from the palm of his hand to the tips of his fingers.  Patient was able to store 1 large checker, however was unable to move them through his hand towards his finger tips.  Patient continues to present with limited translatory movements of the hand.  Patient presents with increased compensation proximally in the left shoulder when attempting to perform fine motor coordination skills using the grooved pegboard. Patient was able to complete thumb opposition without difficulty. Patient continues to benefit from OT services to work on improving left grip strength, pinch strength, coordination, and hand function skills in order to improve left hand functioning needed to maximize independence with, and complete ADL and IADL tasks efficiently.  PERFORMANCE DEFICITS in functional skills including ADLs, IADLs, coordination, dexterity, strength, pain, [place], and GMC, cognitive skills including: attention and memory, and psychosocial skills including coping strategies, environmental adaptation, and routines and behaviors.   IMPAIRMENTS are limiting patient from ADLs, IADLs, and social participation.   COMORBIDITIES may have co-morbidities  that affects occupational performance. Patient will benefit from skilled OT to address above impairments and improve overall function.  MODIFICATION OR ASSISTANCE TO COMPLETE EVALUATION: Min-Moderate modification of tasks or assist with assess necessary to complete an evaluation.  OT OCCUPATIONAL PROFILE AND HISTORY: Detailed assessment: Review of records and additional review of physical, cognitive, psychosocial history related to current functional performance.  CLINICAL DECISION MAKING: Moderate - several  treatment options, min-mod task modification necessary  REHAB POTENTIAL: Good  EVALUATION COMPLEXITY: Moderate    PLAN: OT FREQUENCY: 2x/week  OT DURATION: 12 weeks  PLANNED INTERVENTIONS: self care/ADL training, therapeutic exercise, therapeutic activity, neuromuscular re-education, manual therapy, paraffin, and moist heat  RECOMMENDED OTHER SERVICES:   CONSULTED AND AGREED WITH PLAN OF CARE: Patient and family Midwife  PLAN FOR NEXT SESSION:  Initiate Treatment sessions   Harrel Carina, MS, OTR/L  Harrel Carina, OT 04/19/2022, 5:00 PM

## 2022-04-24 ENCOUNTER — Ambulatory Visit: Payer: Medicare Other | Admitting: Occupational Therapy

## 2022-04-24 ENCOUNTER — Encounter: Payer: Self-pay | Admitting: Occupational Therapy

## 2022-04-24 DIAGNOSIS — M6281 Muscle weakness (generalized): Secondary | ICD-10-CM | POA: Diagnosis not present

## 2022-04-24 DIAGNOSIS — R278 Other lack of coordination: Secondary | ICD-10-CM

## 2022-04-24 NOTE — Therapy (Addendum)
OUTPATIENT OCCUPATIONAL THERAPY NEURO TREATMENT NOTE  Patient Name: James Herrera. MRN: [id number] DOB:[birth year], 88 y.o., male Today's Date: 04/24/2022  PCP: Dr. Ginette Pitman REFERRING PROVIDER: Dr. Melrose Nakayama   OT End of Session - 04/24/22 1356     Visit Number 4    Number of Visits 24    Date for OT Re-Evaluation 07/05/22    OT Start Time 0930    OT Stop Time 1015    OT Time Calculation (min) 45 min    Activity Tolerance Patient tolerated treatment well    Behavior During Therapy WFL for tasks assessed/performed             Past Medical History:  Diagnosis Date   BPH (benign prostatic hyperplasia)    History of kidney stones    Hypertension    Past Surgical History:  Procedure Laterality Date   HERNIA REPAIR Right    HERNIA REPAIR Left    HERNIA REPAIR     umbilical   kidney stone removal      x2   PROSTATE BIOPSY N/A 04/22/2020   Procedure: PROSTATE BIOPSY [name];  Surgeon: Royston Cowper, MD;  Location: [facility] ORS;  Service: Urology;  Laterality: N/A;   TONSILLECTOMY     There are no problems to display for this patient.   ONSET DATE: 01/08/2020  REFERRING DIAG: LUE weakness   THERAPY DIAG:   Left upper extremity weakness  Rationale for Evaluation and Treatment Rehabilitation  SUBJECTIVE:   SUBJECTIVE STATEMENT:  Patient doing well today.   Pt accompanied by: self and significant other  PERTINENT HISTORY: Patient is an 88 year old male who was referred to OT services by Dr. 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  Patient reports left upper extremity changes occurred after receiving the COVID shot in 01/27/2020  PRECAUTIONS: None  WEIGHT BEARING RESTRICTIONS  No  PAIN:  Are you  having pain? No  FALLS: Has patient fallen in last 6 months? Yes, I fall off of a stool  LIVING ENVIRONMENT: Lives with: lives with their spouse Lives in: House/apartment Stairs: 1 story home, 2 steps from [place] Has following equipment at home: Single point cane and Grab bars  PLOF: Independent  PATIENT GOALS  To make it  to 100  OBJECTIVE:   HAND DOMINANCE: Left  ADLs: Overall ADLs:  Transfers/ambulation related to ADLs:Independent Eating:  Shakiness with utensils, some spillage, wife assists with  cutting food Grooming:  Awkward to use the left hand for brushing teeth, toothbrush turns in his mouth UB Dressing: Independent, wife assists with buttoning LB Dressing: Independent; difficulty tying shoes Toileting:  Independent Bathing:  Difficulty holding on to soap Tub Shower transfers:  Walk-in shower. Independent. Wife reports being present in the house, however does not supervise showers.  Equipment: Long handled shoe horn   IADLs: Shopping:  The transaction is a challenge Light housekeeping:  Mostly uses the right for the dishes,  Independent laundry, mowing, picking up sticks Meal Prep:  Difficulty with opening packets, packages, and zip lock bags Community mobility:  Driving Medication management: Independent using a pillbox. Difficulty grasping small pills with the left hand Financial management:  Wife performs Handwriting: 25% legible  MOBILITY STATUS: Independent  POSTURE COMMENTS:   Sitting balance: Independent  ACTIVITY TOLERANCE: Activity tolerance: Within functional limits  FUNCTIONAL OUTCOME MEASURES: FOTO: 63 TR score: 63  UPPER EXTREMITY ROM                BUEs WFL   UPPER EXTREMITY MMT:     MMT Right eval Left eval  Shoulder flexion 5/5 5/5  Shoulder abduction 5/5 5/5  Shoulder adduction    Shoulder extension    Shoulder internal rotation    Shoulder external rotation    Middle trapezius    Lower trapezius    Elbow flexion 5/5  5/5  Elbow extension 5/5 5/5  Wrist flexion    Wrist extension 5/5 5/5  Wrist ulnar deviation    Wrist radial deviation    Wrist pronation    Wrist supination    (Blank rows = not tested)  HAND FUNCTION: Grip strength: Right: 65 lbs; Left: 25 lbs Pinch strength: Lateral: R: 15, L:11, 3pt. R: 13 L: 10  COORDINATION: 9 Hole Peg test: Right: 43 sec; Left: 2 min. 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 worked on grasping one inch resistive cubes alternating thumb opposition to the tip of the 2nd through 5th digits. The board was positioned at a vertical angle. Pt. worked on pressing them back into place while isolating 2nd through 5th digits. Patient worked on left hand fine motor coordination skills with emphasis placed on grasping, and storing the 1" circular pegs. Pt. worked  on Clinical cytogeneticist moving the pegs form the palm to the tip of the 2nd digit, and thumb in preparation for placing them on the pegboard placed flat at the tabletop surface, as well as at a vertical angle.  There. Ex.:  Pt. Performed left gross gripping with a gross grip strengthener. Pt. worked on sustaining grip while grasping pegs and reaching at various heights. The Gripper was set to  17.9# of grip strength resistance.  Patient worked on digit flexion using the 1.5 pound digit flex.  Patient had difficulty isolating his fourth and fifth digits.  PATIENT EDUCATION: Education details: OT services, plan of care, goals Person educated: Patient and  Spouse Education method: Explanation, Demonstration, and Tactile cues Verbal cues Education comprehension: verbalized understanding and returned demonstration   HOME EXERCISE PROGRAM:             [employer] green Theraputty exercise program for left hand strengthening    GOALS: Goals reviewed with patient? Yes  SHORT TERM GOALS: Target date: 05/24/2022    Patient will be independent with home exercise programs for the left hand Baseline: Eval: Currently no home exercise program Goal status: INITIAL    LONG TERM GOALS: Target date: 07/05/2022    Patient will improve left grip strength by 5 pounds to be able to securely hold a bar of soap while showering. Baseline: Eval: patient has difficulty holding a bar of soap securely Goal status: INITIAL  2.  Patient will independently sign his name 75% legibility Baseline: Eval:  25% legible for signing his name Goal status: INITIAL  3.  Patient will put on his shirt  with modified independence Baseline: Eval: Patient is unable to button shirt buttons Goal status: INITIAL  4.  Patient will improve left hand fine motor coordination skills to be able to manipulate small items or pills efficiently Baseline: Eval: Patient has difficulty manipulating small objects with his dominant left hand Goal status: INITIAL  5.  Patient will improve left lateral pinch strength to be able to hold a knife in preparation for cutting food Baseline: Eval: Patient is unable to hold a knife while cutting food Goal status: INITIAL  6.  Patient will improve FOTO score by 2 points for improved patient perceived performance in assessment specific ADLs,and IADLs Baseline: Eval: FOTO 63; TR score 63 Goal status: INITIAL  ASSESSMENT:  CLINICAL IMPRESSION:  Patient reports having to cancel his appointment on Wednesday as his wife is having surgery on Tuesday and has to follow-up with the surgeon on Wednesday. Patient is making progress, and is improving with  translatory movements of the hand with larger 1 inch objects. Patient presented with less compensation proximally in the shoulder during fine motor coordination tasks.  Patient continues to present with limited fine motor coordination skills in the left hand as well as translatory movements. Patient dropped multiple pads again on sustaining left grip while reaching up to place pegs into the container at various angle. Patient continues to benefit from OT services to work on improving left grip strength, pinch strength, coordination, and hand function skills in order to improve left hand functioning needed to maximize independence with, and complete ADL and IADL tasks efficiently.  PERFORMANCE DEFICITS in functional skills including ADLs, IADLs, coordination, dexterity, strength, pain, [place], and GMC, cognitive skills including: attention and memory, and psychosocial skills including coping strategies, environmental adaptation, and routines and behaviors.   IMPAIRMENTS are limiting patient from ADLs, IADLs, and social participation.   COMORBIDITIES may have co-morbidities  that affects occupational performance. Patient will benefit from skilled OT to address above impairments and improve overall function.  MODIFICATION OR ASSISTANCE TO COMPLETE EVALUATION: Min-Moderate modification of tasks or assist with assess necessary to complete an evaluation.  OT OCCUPATIONAL PROFILE AND HISTORY: Detailed assessment: Review of records and additional review of physical, cognitive, psychosocial history related to current functional performance.  CLINICAL DECISION MAKING: Moderate - several treatment options, min-mod task modification necessary  REHAB POTENTIAL: Good  EVALUATION COMPLEXITY: Moderate    PLAN: OT FREQUENCY: 2x/week  OT DURATION: 12 weeks  PLANNED INTERVENTIONS: self care/ADL training, therapeutic exercise, therapeutic activity, neuromuscular re-education, manual therapy, paraffin, and moist  heat  RECOMMENDED OTHER SERVICES:   CONSULTED AND AGREED WITH PLAN OF CARE: Patient and family Midwife  PLAN FOR NEXT SESSION:  Initiate Treatment sessions   Harrel Carina, MS, OTR/L  Harrel Carina, OT 04/24/2022, 1:58 PM

## 2022-04-26 ENCOUNTER — Encounter: Payer: Medicare Other | Admitting: Occupational Therapy

## 2022-05-01 ENCOUNTER — Encounter: Payer: Self-pay | Admitting: Occupational Therapy

## 2022-05-01 ENCOUNTER — Ambulatory Visit: Payer: Medicare Other | Admitting: Occupational Therapy

## 2022-05-01 DIAGNOSIS — R278 Other lack of coordination: Secondary | ICD-10-CM

## 2022-05-01 DIAGNOSIS — M6281 Muscle weakness (generalized): Secondary | ICD-10-CM | POA: Diagnosis not present

## 2022-05-01 NOTE — Therapy (Signed)
OUTPATIENT OCCUPATIONAL THERAPY NEURO TREATMENT NOTE  Patient Name: James Herrera. MRN: [id number] DOB:1933-12-09, 88 y.o., male Today's Date: 05/01/2022  PCP: Dr. Ginette Pitman REFERRING PROVIDER: Dr. Melrose Nakayama   OT End of Session - 05/01/22 1340     Visit Number 5    Number of Visits 24    Date for OT Re-Evaluation 07/05/22    OT Start Time 0930    OT Stop Time 9562    OT Time Calculation (min) 45 min    Activity Tolerance Patient tolerated treatment well    Behavior During Therapy WFL for tasks assessed/performed             Past Medical History:  Diagnosis Date   BPH (benign prostatic hyperplasia)    History of kidney stones    Hypertension    Past Surgical History:  Procedure Laterality Date   HERNIA REPAIR Right    HERNIA REPAIR Left    HERNIA REPAIR     umbilical   kidney stone removal      x2   PROSTATE BIOPSY N/A 04/22/2020   Procedure: PROSTATE BIOPSY [name];  Surgeon: Royston Cowper, MD;  Location: [facility] OR[facility];  Service: Urology;  Laterality: N/A;   TONSILLECTOMY     There are no problems to display for this patient.   ONSET DATE: 01/08/2020  REFERRING DIAG: LUE weakness   THERAPY DIAG:   Left upper extremity weakness  Rationale for Evaluation and Treatment Rehabilitation  SUBJECTIVE:   SUBJECTIVE STATEMENT:  Patient doing well today.   Pt accompanied by: self and significant other  PERTINENT HISTORY: Patient is an 88 year old male who was referred to OT services by Dr. 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  Patient reports left upper extremity changes occurred after receiving the COVID shot in 01/27/2020  PRECAUTIONS: None  WEIGHT BEARING RESTRICTIONS  No  PAIN:  Are you  having pain? No  FALLS: Has patient fallen in last 6 months? Yes, I fall off of a stool  LIVING ENVIRONMENT: Lives with: lives with their spouse Lives in: House/apartment Stairs: 1 story home, 2 steps from [place] Has following equipment at home: Single point cane and Grab bars  PLOF: Independent  PATIENT GOALS  To make it  to 100  OBJECTIVE:   HAND DOMINANCE: Left  ADLs: Overall ADLs:  Transfers/ambulation related to ADLs:Independent Eating:  Shakiness with utensils, some spillage, wife assists with  cutting food Grooming:  Awkward to use the left hand for brushing teeth, toothbrush turns in his mouth UB Dressing: Independent, wife assists with buttoning LB Dressing: Independent; difficulty tying shoes Toileting:  Independent Bathing:  Difficulty holding on to soap Tub Shower transfers:  Walk-in shower. Independent. Wife reports being present in the house, however does not supervise showers.  Equipment: Long handled shoe horn   IADLs: Shopping:  The transaction is a challenge Light housekeeping:  Mostly uses the right for the dishes,  Independent laundry, mowing, picking up sticks Meal Prep:  Difficulty with opening packets, packages, and zip lock bags Community mobility:  Driving Medication management: Independent using a pillbox. Difficulty grasping small pills with the left hand Financial management:  Wife performs Handwriting: 25% legible  MOBILITY STATUS: Independent  POSTURE COMMENTS:   Sitting balance: Independent  ACTIVITY TOLERANCE: Activity tolerance: Within functional limits  FUNCTIONAL OUTCOME MEASURES: FOTO: 63 TR score: 63  UPPER EXTREMITY ROM                BUEs WFL   UPPER EXTREMITY MMT:     MMT Right eval Left eval  Shoulder flexion 5/5 5/5  Shoulder abduction 5/5 5/5  Shoulder adduction    Shoulder extension    Shoulder internal rotation    Shoulder external rotation    Middle trapezius    Lower trapezius    Elbow flexion 5/5  5/5  Elbow extension 5/5 5/5  Wrist flexion    Wrist extension 5/5 5/5  Wrist ulnar deviation    Wrist radial deviation    Wrist pronation    Wrist supination    (Blank rows = not tested)  HAND FUNCTION: Grip strength: Right: 65 lbs; Left: 25 lbs Pinch strength: Lateral: R: 15, L:11, 3pt. R: 13 L: 10  COORDINATION: 9 Hole Peg test: Right: 43 sec; Left: 2 min. 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 worked on grasping, and manipulating 1", 3/4", and 1/2" washers from a magnetic dish with the left hand. Pt. worked on reaching up, stabilizing, and sustaining shoulder elevation while placing the washers over a small precise target on vertical dowels positioned at various angles. Pt. worked on storing the washers, and translatory movements moving the pegs form his palm to the tip of his 2nd digit, and thumb in preparation for placing them into the pegboard.  Pt. worked on storing washers in the ulnar aspect of the hand, while using the radial aspect of his hand for manipulating the options.   There. Ex.:  Pt. performed left gross gripping with a gross grip strengthener. Pt. worked on sustaining grip while grasping pegs and reaching at various heights. The gripper was set to 17.9# of grip strength resistance. Pt. Worked on pinch strengthening in the left hand for lateral, and 3pt. pinch using yellow, red, green, and blue resistive clips. Pt. worked on placing the clips at various vertical and horizontal angles. Tactile and verbal  cues were required for eliciting the desired movement. Pt. Worked on strengthening 4th, and 5th digit flexors.   Patient is making progress, and reports being able to hold, and handle utensils better at home. Pt. has improved with moving the objects from the tip of his fingers to his palm in preparation for placing them into his hand for storage. Pt. continues to present with less compensation proximally in the shoulder during fine motor coordination tasks. Patient continues to present with limited fine motor coordination skills in the left hand as well as translatory movements. Patient continues to benefit from OT services to work on improving left grip strength, pinch strength, coordination, and hand function skills in order to improve left hand functioning needed to maximize independence with, and complete ADL and IADL tasks efficiently.   PATIENT EDUCATION: Education details: OT services, plan of care, goals Person educated: Patient and Spouse Education method: Explanation, Demonstration, and Tactile cues Verbal cues Education comprehension: verbalized  understanding and returned demonstration   HOME EXERCISE PROGRAM:             [employer] green Theraputty exercise program for left hand strengthening    GOALS: Goals reviewed with patient? Yes  SHORT TERM GOALS: Target date: 05/24/2022    Patient will be independent with home exercise programs for the left hand Baseline: Eval: Currently no home exercise program Goal status: INITIAL    LONG TERM GOALS: Target date: 07/05/2022    Patient will improve left grip strength by 5 pounds to be able to securely hold a bar of soap while showering. Baseline: Eval: patient has difficulty holding a bar of soap securely Goal status: INITIAL  2.  Patient will independently sign his name 75% legibility Baseline: Eval:  25% legible for signing his name Goal status: INITIAL  3.  Patient will put on his shirt with modified  independence Baseline: Eval: Patient is unable to button shirt buttons Goal status: INITIAL  4.  Patient will improve left hand fine motor coordination skills to be able to manipulate small items or pills efficiently Baseline: Eval: Patient has difficulty manipulating small objects with his dominant left hand Goal status: INITIAL  5.  Patient will improve left lateral pinch strength to be able to hold a knife in preparation for cutting food Baseline: Eval: Patient is unable to hold a knife while cutting food Goal status: INITIAL  6.  Patient will improve FOTO score by 2 points for improved patient perceived performance in assessment specific ADLs,and IADLs Baseline: Eval: FOTO 63; TR score 63 Goal status: INITIAL  ASSESSMENT:  CLINICAL IMPRESSION:  Patient is making progress, and reports being able to hold, and handle utensils better at home. Pt. has improved with moving the objects from the tip of his fingers to his palm in preparation for placing them into his hand for storage. Pt. continues to present with less compensation proximally in the shoulder during fine motor coordination tasks. Patient continues to present with limited fine motor coordination skills in the left hand as well as translatory movements. Patient continues to benefit from OT services to work on improving left grip strength, pinch strength, coordination, and hand function skills in order to improve left hand functioning needed to maximize independence with, and complete ADL and IADL tasks efficiently.  PERFORMANCE DEFICITS in functional skills including ADLs, IADLs, coordination, dexterity, strength, pain, [place], and GMC, cognitive skills including: attention and memory, and psychosocial skills including coping strategies, environmental adaptation, and routines and behaviors.   IMPAIRMENTS are limiting patient from ADLs, IADLs, and social participation.   COMORBIDITIES may have co-morbidities  that affects occupational  performance. Patient will benefit from skilled OT to address above impairments and improve overall function.  MODIFICATION OR ASSISTANCE TO COMPLETE EVALUATION: Min-Moderate modification of tasks or assist with assess necessary to complete an evaluation.  OT OCCUPATIONAL PROFILE AND HISTORY: Detailed assessment: Review of records and additional review of physical, cognitive, psychosocial history related to current functional performance.  CLINICAL DECISION MAKING: Moderate - several treatment options, min-mod task modification necessary  REHAB POTENTIAL: Good  EVALUATION COMPLEXITY: Moderate    PLAN: OT FREQUENCY: 2x/week  OT DURATION: 12 weeks  PLANNED INTERVENTIONS: self care/ADL training, therapeutic exercise, therapeutic activity, neuromuscular re-education, manual therapy, paraffin, and moist heat  RECOMMENDED OTHER SERVICES:   CONSULTED AND AGREED WITH PLAN OF CARE: Patient and family Midwife  PLAN FOR NEXT SESSION:  Initiate Treatment sessions   Harrel Carina, MS, OTR/L   Harrel Carina, OT 05/01/2022,  2:31 PM

## 2022-05-03 ENCOUNTER — Encounter: Payer: Self-pay | Admitting: Occupational Therapy

## 2022-05-03 ENCOUNTER — Ambulatory Visit: Payer: Medicare Other | Admitting: Occupational Therapy

## 2022-05-03 DIAGNOSIS — M6281 Muscle weakness (generalized): Secondary | ICD-10-CM | POA: Diagnosis not present

## 2022-05-03 DIAGNOSIS — R278 Other lack of coordination: Secondary | ICD-10-CM

## 2022-05-03 NOTE — Therapy (Signed)
OUTPATIENT OCCUPATIONAL THERAPY NEURO TREATMENT NOTE  Patient Name: James Herrera. MRN: [id number] DOB:[birth year], 88 y.o., male Today's Date: 05/03/2022  PCP: Dr. Ginette Pitman REFERRING PROVIDER: Dr. Melrose Nakayama   OT End of Session - 05/03/22 1202     Visit Number 6    Number of Visits 24    Date for OT Re-Evaluation 07/05/22    OT Start Time 1140    OT Stop Time 1225    OT Time Calculation (min) 45 min    Activity Tolerance Patient tolerated treatment well    Behavior During Therapy WFL for tasks assessed/performed             Past Medical History:  Diagnosis Date   BPH (benign prostatic hyperplasia)    History of kidney stones    Hypertension    Past Surgical History:  Procedure Laterality Date   HERNIA REPAIR Right    HERNIA REPAIR Left    HERNIA REPAIR     umbilical   kidney stone removal      x2   PROSTATE BIOPSY N/A 04/22/2020   Procedure: PROSTATE BIOPSY [name];  Surgeon: Royston Cowper, MD;  Location: [facility] ORS;  Service: Urology;  Laterality: N/A;   TONSILLECTOMY     There are no problems to display for this patient.   ONSET DATE: 01/08/2020  REFERRING DIAG: LUE weakness   THERAPY DIAG:   Left upper extremity weakness  Rationale for Evaluation and Treatment Rehabilitation  SUBJECTIVE:   SUBJECTIVE STATEMENT:  Patient doing well today.   Pt accompanied by: self and significant other  PERTINENT HISTORY: Patient is an 88 year old male who was referred to OT services by Dr. 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  Patient reports left upper extremity changes occurred after receiving the COVID shot in 01/27/2020  PRECAUTIONS: None  WEIGHT BEARING RESTRICTIONS  No  PAIN:  Are you  having pain? No  FALLS: Has patient fallen in last 6 months? Yes, I fall off of a stool  LIVING ENVIRONMENT: Lives with: lives with their spouse Lives in: House/apartment Stairs: 1 story home, 2 steps from [place] Has following equipment at home: Single point cane and Grab bars  PLOF: Independent  PATIENT GOALS  To make it  to 100  OBJECTIVE:   HAND DOMINANCE: Left  ADLs: Overall ADLs:  Transfers/ambulation related to ADLs:Independent Eating:  Shakiness with utensils, some spillage, wife assists with  cutting food Grooming:  Awkward to use the left hand for brushing teeth, toothbrush turns in his mouth UB Dressing: Independent, wife assists with buttoning LB Dressing: Independent; difficulty tying shoes Toileting:  Independent Bathing:  Difficulty holding on to soap Tub Shower transfers:  Walk-in shower. Independent. Wife reports being present in the house, however does not supervise showers.  Equipment: Long handled shoe horn   IADLs: Shopping:  The transaction is a challenge Light housekeeping:  Mostly uses the right for the dishes,  Independent laundry, mowing, picking up sticks Meal Prep:  Difficulty with opening packets, packages, and zip lock bags Community mobility:  Driving Medication management: Independent using a pillbox. Difficulty grasping small pills with the left hand Financial management:  Wife performs Handwriting: 25% legible  MOBILITY STATUS: Independent  POSTURE COMMENTS:   Sitting balance: Independent  ACTIVITY TOLERANCE: Activity tolerance: Within functional limits  FUNCTIONAL OUTCOME MEASURES: FOTO: 63 TR score: 63  UPPER EXTREMITY ROM                BUEs WFL   UPPER EXTREMITY MMT:     MMT Right eval Left eval  Shoulder flexion 5/5 5/5  Shoulder abduction 5/5 5/5  Shoulder adduction    Shoulder extension    Shoulder internal rotation    Shoulder external rotation    Middle trapezius    Lower trapezius    Elbow flexion 5/5  5/5  Elbow extension 5/5 5/5  Wrist flexion    Wrist extension 5/5 5/5  Wrist ulnar deviation    Wrist radial deviation    Wrist pronation    Wrist supination    (Blank rows = not tested)  HAND FUNCTION: Grip strength: Right: 65 lbs; Left: 25 lbs Pinch strength: Lateral: R: 15, L:11, 3pt. R: 13 L: 10  COORDINATION: 9 Hole Peg test: Right: 43 sec; Left: 2 min. 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 worked on endurance with [place]  grasping, flipping, turning, and stacking [place] style discs in 4 rows of 15. Pt. required visual demonstration, and cues for movement patterns. Pt. worked on grasping and turning them in a full 360 degree rotation for the 60 discs. Pt. worked on turning them in a 180 degree rotation with increasing speed for 30 discs in the first 2 rows closest to the Pt.      Patient continues to make progress. Pt. reports that his wife has noticed his progress. Pt. reports that it is easier for him to use, and handle utensils. Pt. has improved with moving the objects from the tip of his fingers to his palm in preparation for placing them into his hand for storage. Pt. continues to present with less compensation proximally in the shoulder during fine motor coordination tasks. Patient continues to present with limited fine motor coordination skills in the left hand as well as translatory movements. Patient continues to benefit from OT services to work on improving left grip strength, pinch strength, coordination, and  hand function skills in order to improve left hand functioning needed to maximize independence with, and complete ADL and IADL tasks efficiently.   PATIENT EDUCATION: Education details: OT services, plan of care, goals Person educated: Patient and Spouse Education method: Explanation, Demonstration, and Tactile cues Verbal cues Education comprehension: verbalized understanding and returned demonstration   HOME EXERCISE PROGRAM:             [employer] green Theraputty exercise program for left hand strengthening    GOALS: Goals reviewed with patient? Yes  SHORT TERM GOALS: Target date: 05/24/2022    Patient will be independent with home exercise programs for the left hand Baseline: Eval: Currently no home exercise program Goal status: INITIAL    LONG TERM GOALS: Target date: 07/05/2022    Patient will improve left grip strength by 5 pounds to be able to securely hold a bar of soap while showering. Baseline: Eval: patient has difficulty  holding a bar of soap securely Goal status: INITIAL  2.  Patient will independently sign his name 75% legibility Baseline: Eval:  25% legible for signing his name Goal status: INITIAL  3.  Patient will put on his shirt with modified independence Baseline: Eval: Patient is unable to button shirt buttons Goal status: INITIAL  4.  Patient will improve left hand fine motor coordination skills to be able to manipulate small items or pills efficiently Baseline: Eval: Patient has difficulty manipulating small objects with his dominant left hand Goal status: INITIAL  5.  Patient will improve left lateral pinch strength to be able to hold a knife in preparation for cutting food Baseline: Eval: Patient is unable to hold a knife while cutting food Goal status: INITIAL  6.  Patient will improve FOTO score by 2 points for improved patient perceived performance in assessment specific ADLs,and IADLs Baseline: Eval: FOTO 63; TR score 63 Goal status:  INITIAL  ASSESSMENT:  CLINICAL IMPRESSION:  Patient continues to make progress. Pt. reports that his wife has noticed his progress. Pt. reports that it is easier for him to use, and handle utensils. Pt. has improved with moving the objects from the tip of his fingers to his palm in preparation for placing them into his hand for storage. Pt. continues to present with less compensation proximally in the shoulder during fine motor coordination tasks. Patient continues to present with limited fine motor coordination skills in the left hand as well as translatory movements. Patient continues to benefit from OT services to work on improving left grip strength, pinch strength, coordination, and hand function skills in order to improve left hand functioning needed to maximize independence with, and complete ADL and IADL tasks efficiently.  PERFORMANCE DEFICITS in functional skills including ADLs, IADLs, coordination, dexterity, strength, pain, [place], and GMC, cognitive skills including: attention and memory, and psychosocial skills including coping strategies, environmental adaptation, and routines and behaviors.   IMPAIRMENTS are limiting patient from ADLs, IADLs, and social participation.   COMORBIDITIES may have co-morbidities  that affects occupational performance. Patient will benefit from skilled OT to address above impairments and improve overall function.  MODIFICATION OR ASSISTANCE TO COMPLETE EVALUATION: Min-Moderate modification of tasks or assist with assess necessary to complete an evaluation.  OT OCCUPATIONAL PROFILE AND HISTORY: Detailed assessment: Review of records and additional review of physical, cognitive, psychosocial history related to current functional performance.  CLINICAL DECISION MAKING: Moderate - several treatment options, min-mod task modification necessary  REHAB POTENTIAL: Good  EVALUATION COMPLEXITY: Moderate    PLAN: OT FREQUENCY: 2x/week  OT DURATION: 12  weeks  PLANNED INTERVENTIONS: self care/ADL training, therapeutic exercise, therapeutic activity, neuromuscular re-education, manual therapy, paraffin, and moist heat  RECOMMENDED OTHER SERVICES:   CONSULTED AND AGREED WITH PLAN OF CARE: Patient and family Midwife  PLAN FOR NEXT SESSION:  Initiate Treatment sessions   Harrel Carina, MS, OTR/L   Harrel Carina, OT 05/03/2022, 4:03 PM

## 2022-05-08 ENCOUNTER — Encounter: Payer: Self-pay | Admitting: Occupational Therapy

## 2022-05-08 ENCOUNTER — Ambulatory Visit: Payer: Medicare Other | Admitting: Occupational Therapy

## 2022-05-08 DIAGNOSIS — M6281 Muscle weakness (generalized): Secondary | ICD-10-CM

## 2022-05-08 DIAGNOSIS — R278 Other lack of coordination: Secondary | ICD-10-CM

## 2022-05-08 NOTE — Therapy (Signed)
OUTPATIENT OCCUPATIONAL THERAPY NEURO TREATMENT NOTE  Patient Name: James Herrera. MRN: [id number] DOB:03/27/1934, 88 y.o., male Today's Date: 05/08/2022  PCP: Dr. Ginette Pitman REFERRING PROVIDER: Dr. Melrose Nakayama   OT End of Session - 05/08/22 1202     Visit Number 7    Number of Visits 24    Date for OT Re-Evaluation 07/05/22    OT Start Time 7654    OT Stop Time 79    OT Time Calculation (min) 45 min    Activity Tolerance Patient tolerated treatment well    Behavior During Therapy WFL for tasks assessed/performed             Past Medical History:  Diagnosis Date   BPH (benign prostatic hyperplasia)    History of kidney stones    Hypertension    Past Surgical History:  Procedure Laterality Date   HERNIA REPAIR Right    HERNIA REPAIR Left    HERNIA REPAIR     umbilical   kidney stone removal      x2   PROSTATE BIOPSY N/A 04/22/2020   Procedure: PROSTATE BIOPSY [name];  Surgeon: Royston Cowper, MD;  Location: [facility] OR[facility];  Service: Urology;  Laterality: N/A;   TONSILLECTOMY     There are no problems to display for this patient.   ONSET DATE: 01/08/2020  REFERRING DIAG: LUE weakness   THERAPY DIAG:   Left upper extremity weakness  Rationale for Evaluation and Treatment Rehabilitation  SUBJECTIVE:   SUBJECTIVE STATEMENT:  Patient doing well today.   Pt accompanied by: self and significant other  PERTINENT HISTORY: Patient is an 88 year old male who was referred to OT services by Dr. 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  Patient reports left upper extremity changes occurred after receiving the COVID shot in 01/27/2020  PRECAUTIONS: None  WEIGHT BEARING RESTRICTIONS  No  PAIN:  Are you  having pain? No  FALLS: Has patient fallen in last 6 months? Yes, I fall off of a stool  LIVING ENVIRONMENT: Lives with: lives with their spouse Lives in: House/apartment Stairs: 1 story home, 2 steps from [place] Has following equipment at home: Single point cane and Grab bars  PLOF: Independent  PATIENT GOALS  To make it  to 100  OBJECTIVE:   HAND DOMINANCE: Left  ADLs: Overall ADLs:  Transfers/ambulation related to ADLs:Independent Eating:  Shakiness with utensils, some spillage, wife assists with  cutting food Grooming:  Awkward to use the left hand for brushing teeth, toothbrush turns in his mouth UB Dressing: Independent, wife assists with buttoning LB Dressing: Independent; difficulty tying shoes Toileting:  Independent Bathing:  Difficulty holding on to soap Tub Shower transfers:  Walk-in shower. Independent. Wife reports being present in the house, however does not supervise showers.  Equipment: Long handled shoe horn   IADLs: Shopping:  The transaction is a challenge Light housekeeping:  Mostly uses the right for the dishes,  Independent laundry, mowing, picking up sticks Meal Prep:  Difficulty with opening packets, packages, and zip lock bags Community mobility:  Driving Medication management: Independent using a pillbox. Difficulty grasping small pills with the left hand Financial management:  Wife performs Handwriting: 25% legible  MOBILITY STATUS: Independent  POSTURE COMMENTS:   Sitting balance: Independent  ACTIVITY TOLERANCE: Activity tolerance: Within functional limits  FUNCTIONAL OUTCOME MEASURES: FOTO: 63 TR score: 63  UPPER EXTREMITY ROM                BUEs WFL   UPPER EXTREMITY MMT:     MMT Right eval Left eval  Shoulder flexion 5/5 5/5  Shoulder abduction 5/5 5/5  Shoulder adduction    Shoulder extension    Shoulder internal rotation    Shoulder external rotation    Middle trapezius    Lower trapezius    Elbow flexion 5/5  5/5  Elbow extension 5/5 5/5  Wrist flexion    Wrist extension 5/5 5/5  Wrist ulnar deviation    Wrist radial deviation    Wrist pronation    Wrist supination    (Blank rows = not tested)  HAND FUNCTION: Grip strength: Right: 65 lbs; Left: 25 lbs Pinch strength: Lateral: R: 15, L:11, 3pt. R: 13 L: 10  COORDINATION: 9 Hole Peg test: Right: 43 sec; Left: 2 min. 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 worked on grasping, and manipulating 1/2" washers from a magnetic dish using a 2 pt. grasp pattern. Pt. worked on reaching up, stabilizing, and sustaining shoulder elevation while placing the washer over a small precise target on vertical dowels positioned at various angles. Pt. worked on [facility] skills grasping 1" sticks, 1/4" collars, and 1/4" washers. Pt. worked on storing the objects in her, and translatory skills moving the items from the palm of the hand to the tip of the 2nd digit, and thumb.    PATIENT EDUCATION: Education details: OT services, plan of care, goals Person educated: Patient and Spouse Education method: Explanation, Demonstration, and Tactile cues Verbal cues Education comprehension: verbalized understanding and returned demonstration   HOME EXERCISE PROGRAM:             [employer] green Theraputty exercise program for left hand strengthening    GOALS: Goals reviewed with patient? Yes  SHORT TERM GOALS: Target date: 05/24/2022    Patient will be independent with home exercise programs for the left  hand Baseline: Eval: Currently no home exercise program Goal status: INITIAL    LONG TERM GOALS: Target date: 07/05/2022    Patient will improve left grip strength by 5 pounds to be able to securely hold a bar of soap while showering. Baseline: Eval: patient has difficulty holding a bar of soap securely Goal status: INITIAL  2.  Patient will independently sign his name 75% legibility Baseline: Eval:  25% legible for signing his name Goal status: INITIAL  3.  Patient will put on his shirt with modified independence Baseline: Eval: Patient is unable to button shirt buttons Goal status: INITIAL  4.  Patient will improve left hand fine motor coordination skills to be able to manipulate small items or pills efficiently Baseline: Eval: Patient has difficulty manipulating small objects with his dominant left hand Goal status: INITIAL  5.  Patient will improve left lateral pinch strength to be able to hold a knife in preparation for cutting food Baseline:  Eval: Patient is unable to hold a knife while cutting food Goal status: INITIAL  6.  Patient will improve FOTO score by 2 points for improved patient perceived performance in assessment specific ADLs,and IADLs Baseline: Eval: FOTO 63; TR score 63 Goal status: INITIAL  ASSESSMENT:  CLINICAL IMPRESSION:  Patient continues to make progress with left hand function. Pt. reports that it is easier for him to use, and handle utensils. Pt. Continues to improve with moving the objects from the tip of his fingers to his palm in preparation for placing them into his hand for storage. Pt. continues to present with less compensation proximally in the shoulder during fine motor coordination tasks. Patient continues to present with limited fine motor coordination skills in the left hand as well as translatory movements. Patient continues to benefit from OT services to work on improving left grip strength, pinch strength, coordination, and hand function  skills in order to improve left hand functioning needed to maximize independence with, and complete ADL and IADL tasks efficiently.  PERFORMANCE DEFICITS in functional skills including ADLs, IADLs, coordination, dexterity, strength, pain, Circle, and GMC, cognitive skills including: attention and memory, and psychosocial skills including coping strategies, environmental adaptation, and routines and behaviors.   IMPAIRMENTS are limiting patient from ADLs, IADLs, and social participation.   COMORBIDITIES may have co-morbidities  that affects occupational performance. Patient will benefit from skilled OT to address above impairments and improve overall function.  MODIFICATION OR ASSISTANCE TO COMPLETE EVALUATION: Min-Moderate modification of tasks or assist with assess necessary to complete an evaluation.  OT OCCUPATIONAL PROFILE AND HISTORY: Detailed assessment: Review of records and additional review of physical, cognitive, psychosocial history related to current functional performance.  CLINICAL DECISION MAKING: Moderate - several treatment options, min-mod task modification necessary  REHAB POTENTIAL: Good  EVALUATION COMPLEXITY: Moderate    PLAN: OT FREQUENCY: 2x/week  OT DURATION: 12 weeks  PLANNED INTERVENTIONS: self care/ADL training, therapeutic exercise, therapeutic activity, neuromuscular re-education, manual therapy, paraffin, and moist heat  RECOMMENDED OTHER SERVICES:   CONSULTED AND AGREED WITH PLAN OF CARE: Patient and family Midwife  PLAN FOR NEXT SESSION:  Initiate Treatment sessions   Harrel Carina, MS, OTR/L   Harrel Carina, OT 05/08/2022, 12:06 PM

## 2022-05-10 ENCOUNTER — Encounter: Payer: Self-pay | Admitting: Occupational Therapy

## 2022-05-10 ENCOUNTER — Ambulatory Visit: Payer: Medicare Other | Attending: Neurology | Admitting: Occupational Therapy

## 2022-05-10 DIAGNOSIS — M6281 Muscle weakness (generalized): Secondary | ICD-10-CM | POA: Insufficient documentation

## 2022-05-10 DIAGNOSIS — R278 Other lack of coordination: Secondary | ICD-10-CM | POA: Insufficient documentation

## 2022-05-10 NOTE — Therapy (Signed)
OUTPATIENT OCCUPATIONAL THERAPY NEURO TREATMENT NOTE  Patient Name: James Herrera. MRN: [id number] DOB:1933/08/10, 88 y.o., male Today's Date: 05/10/2022  PCP: Dr. Ginette Pitman REFERRING PROVIDER: Dr. Melrose Nakayama   OT End of Session - 05/10/22 1334     Visit Number 8    Number of Visits 24    Date for OT Re-Evaluation 07/05/22    OT Start Time 1300    OT Stop Time 1345    OT Time Calculation (min) 45 min    Activity Tolerance Patient tolerated treatment well    Behavior During Therapy WFL for tasks assessed/performed             Past Medical History:  Diagnosis Date   BPH (benign prostatic hyperplasia)    History of kidney stones    Hypertension    Past Surgical History:  Procedure Laterality Date   HERNIA REPAIR Right    HERNIA REPAIR Left    HERNIA REPAIR     umbilical   kidney stone removal      x2   PROSTATE BIOPSY N/A 04/22/2020   Procedure: PROSTATE BIOPSY [name];  Surgeon: Royston Cowper, MD;  Location: [facility] ORS;  Service: Urology;  Laterality: N/A;   TONSILLECTOMY     There are no problems to display for this patient.   ONSET DATE: 01/08/2020  REFERRING DIAG: LUE weakness   THERAPY DIAG:   Left upper extremity weakness  Rationale for Evaluation and Treatment Rehabilitation  SUBJECTIVE:   SUBJECTIVE STATEMENT:  Patient doing well today.   Pt. accompanied by: self and significant other  PERTINENT HISTORY: Patient is an 88 year old male who was referred to OT services by Dr. 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  Patient reports left upper extremity changes occurred after receiving the COVID shot in 01/27/2020  PRECAUTIONS: None  WEIGHT BEARING RESTRICTIONS  No  PAIN:  Are you  having pain? No  FALLS: Has patient fallen in last 6 months? Yes, I fall off of a stool  LIVING ENVIRONMENT: Lives with: lives with their spouse Lives in: House/apartment Stairs: 1 story home, 2 steps from [place] Has following equipment at home: Single point cane and Grab bars  PLOF: Independent  PATIENT GOALS  To make it  to 100  OBJECTIVE:   HAND DOMINANCE: Left  ADLs: Overall ADLs:  Transfers/ambulation related to ADLs:Independent Eating:  Shakiness with utensils, some spillage, wife assists with  cutting food Grooming:  Awkward to use the left hand for brushing teeth, toothbrush turns in his mouth UB Dressing: Independent, wife assists with buttoning LB Dressing: Independent; difficulty tying shoes Toileting:  Independent Bathing:  Difficulty holding on to soap Tub Shower transfers:  Walk-in shower. Independent. Wife reports being present in the house, however does not supervise showers.  Equipment: Long handled shoe horn   IADLs: Shopping:  The transaction is a challenge Light housekeeping:  Mostly uses the right for the dishes,  Independent laundry, mowing, picking up sticks Meal Prep:  Difficulty with opening packets, packages, and zip lock bags Community mobility:  Driving Medication management: Independent using a pillbox. Difficulty grasping small pills with the left hand Financial management:  Wife performs Handwriting: 25% legible  MOBILITY STATUS: Independent  POSTURE COMMENTS:   Sitting balance: Independent  ACTIVITY TOLERANCE: Activity tolerance: Within functional limits  FUNCTIONAL OUTCOME MEASURES: FOTO: 63 TR score: 63  UPPER EXTREMITY ROM                BUEs WFL   UPPER EXTREMITY MMT:     MMT Right eval Left eval  Shoulder flexion 5/5 5/5  Shoulder abduction 5/5 5/5  Shoulder adduction    Shoulder extension    Shoulder internal rotation    Shoulder external rotation    Middle trapezius    Lower trapezius    Elbow flexion 5/5  5/5  Elbow extension 5/5 5/5  Wrist flexion    Wrist extension 5/5 5/5  Wrist ulnar deviation    Wrist radial deviation    Wrist pronation    Wrist supination    (Blank rows = not tested)  HAND FUNCTION: Grip strength: Right: 65 lbs; Left: 25 lbs Pinch strength: Lateral: R: 15, L:11, 3pt. R: 13 L: 10  COORDINATION: 9 Hole Peg test: Right: 43 sec; Left: 2 min. 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 worked on grasping 1" resistive cubes alternating thumb opposition to the tip of the 2nd digit while the board is placed at a vertical angle. Pt. worked on pressing the cubes back into place while alternating isolated 2nd through 5th digit extension. Patient worked on left hand fine motor coordination skills grasping 1/2" circular tipped to pegs, and placing them into a pegboard placed at the tabletop surface. Patient worked on removing the pegs when it is time alternating thumb opposition to the second through fifth digits.  Therapeutic exercise:  Patient worked on intrinsic stretches secondary to left hand stiffness.  She was provided with a home exercise program detailing the exercise progression.  Manual therapy:  Patient tolerated soft tissue mobilizations for carpal and metacarpal spread stretches to left hand 2/2 left hand stiffness.  Manual therapy was performed independent of and in preparation for therapeutic exercise.  PATIENT EDUCATION: Education details: OT services, plan of care, goals Person educated:  Patient and Spouse Education method: Explanation, Demonstration, and Tactile cues Verbal cues Education comprehension: verbalized understanding and returned demonstration   HOME EXERCISE PROGRAM:             [employer] green Theraputty exercise program for left hand strengthening    GOALS: Goals reviewed with patient? Yes  SHORT TERM GOALS: Target date: 05/24/2022    Patient will be independent with home exercise programs for the left hand Baseline: Eval: Currently no home exercise program Goal status: INITIAL    LONG TERM GOALS: Target date: 07/05/2022    Patient will improve left grip strength by 5 pounds to be able to securely hold a bar of soap while showering. Baseline: Eval: patient has difficulty holding a bar of soap securely Goal status: INITIAL  2.  Patient will independently sign his name 75% legibility Baseline: Eval:  25% legible for signing his name Goal status: INITIAL  3.  Patient will put on his shirt with modified independence Baseline: Eval: Patient is unable to button shirt buttons  Goal status: INITIAL  4.  Patient will improve left hand fine motor coordination skills to be able to manipulate small items or pills efficiently Baseline: Eval: Patient has difficulty manipulating small objects with his dominant left hand Goal status: INITIAL  5.  Patient will improve left lateral pinch strength to be able to hold a knife in preparation for cutting food Baseline: Eval: Patient is unable to hold a knife while cutting food Goal status: INITIAL  6.  Patient will improve FOTO score by 2 points for improved patient perceived performance in assessment specific ADLs,and IADLs Baseline: Eval: FOTO 63; TR score 63 Goal status: INITIAL  ASSESSMENT:  CLINICAL IMPRESSION:  Patient continues to make progress with left hand function. Pt. continues to report that it is easier for him now to handle utensils with his left hand. Patient presents with left hand  stiffness however tolerated the manual therapy, and exercises well with less stiffness following.  Patient required verbal cues and cues for visual demonstration of the proper technique.  Patient has improved with fine motor coordination skills however continues to drop small, light objects from his hand. patient continues to benefit from OT services to work on improving left grip strength, pinch strength, [place], and hand function skills in order to improve left hand functioning needed to maximize independence with, and complete ADL and IADL tasks efficiently.  PERFORMANCE DEFICITS in functional skills including ADLs, IADLs, coordination, dexterity, strength, pain, [place], and GMC, cognitive skills including: attention and memory, and psychosocial skills including coping strategies, environmental adaptation, and routines and behaviors.   IMPAIRMENTS are limiting patient from ADLs, IADLs, and social participation.   COMORBIDITIES may have co-morbidities  that affects occupational performance. Patient will benefit from skilled OT to address above impairments and improve overall function.  MODIFICATION OR ASSISTANCE TO COMPLETE EVALUATION: Min-Moderate modification of tasks or assist with assess necessary to complete an evaluation.  OT OCCUPATIONAL PROFILE AND HISTORY: Detailed assessment: Review of records and additional review of physical, cognitive, psychosocial history related to current functional performance.  CLINICAL DECISION MAKING: Moderate - several treatment options, min-mod task modification necessary  REHAB POTENTIAL: Good  EVALUATION COMPLEXITY: Moderate    PLAN: OT FREQUENCY: 2x/week  OT DURATION: 12 weeks  PLANNED INTERVENTIONS: self care/ADL training, therapeutic exercise, therapeutic activity, neuromuscular re-education, manual therapy, paraffin, and moist heat  RECOMMENDED OTHER SERVICES:   CONSULTED AND AGREED WITH PLAN OF CARE: Patient and family Midwife  PLAN  FOR NEXT SESSION:  Initiate Treatment sessions   Harrel Carina, MS, OTR/L   Harrel Carina, OT 05/10/2022, 1:43 PM

## 2022-05-15 ENCOUNTER — Ambulatory Visit: Payer: Medicare Other | Admitting: Occupational Therapy

## 2022-05-15 DIAGNOSIS — M6281 Muscle weakness (generalized): Secondary | ICD-10-CM | POA: Diagnosis not present

## 2022-05-15 DIAGNOSIS — R278 Other lack of coordination: Secondary | ICD-10-CM

## 2022-05-15 NOTE — Therapy (Signed)
OUTPATIENT OCCUPATIONAL THERAPY NEURO TREATMENT/DISCHARGE NOTE  Patient Name: James Herrera. MRN: [id number] DOB:[birth year], 88 y.o., male Today's Date: 05/15/2022  PCP: Dr. Ginette Pitman REFERRING PROVIDER: Dr. Melrose Nakayama   OT End of Session - 05/15/22 1153     Visit Number 9    Number of Visits 24    Date for OT Re-Evaluation 07/05/22    OT Start Time 8099    OT Stop Time 1230    OT Time Calculation (min) 45 min    Activity Tolerance Patient tolerated treatment well    Behavior During Therapy WFL for tasks assessed/performed             Past Medical History:  Diagnosis Date   BPH (benign prostatic hyperplasia)    History of kidney stones    Hypertension    Past Surgical History:  Procedure Laterality Date   HERNIA REPAIR Right    HERNIA REPAIR Left    HERNIA REPAIR     umbilical   kidney stone removal      x2   PROSTATE BIOPSY N/A 04/22/2020   Procedure: PROSTATE BIOPSY [name];  Surgeon: Royston Cowper, MD;  Location: [facility] OR[facility];  Service: Urology;  Laterality: N/A;   TONSILLECTOMY     There are no problems to display for this patient.   ONSET DATE: 01/08/2020  REFERRING DIAG: LUE weakness   THERAPY DIAG:   Left upper extremity weakness  Rationale for Evaluation and Treatment Rehabilitation  SUBJECTIVE:   SUBJECTIVE STATEMENT:   Pt. Reports the intrinsic hand stretches help to loosen his hand  when he has stiffness.  Pt. accompanied by: self and significant other  PERTINENT HISTORY: Patient is an 88 year old male who was referred to OT services by Dr. 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  Patient reports left upper extremity changes occurred after receiving the COVID shot in  01/27/2020  PRECAUTIONS: None  WEIGHT BEARING RESTRICTIONS  No  PAIN:  Are you having pain? No  FALLS: Has patient fallen in last 6 months? Yes, I fall off of a stool  LIVING ENVIRONMENT: Lives with: lives with their spouse Lives in: House/apartment Stairs: 1 story home, 2 steps from [place] Has following equipment at home: Single point cane and Grab bars  PLOF: Independent  PATIENT GOALS  To make it  to 100  OBJECTIVE:   HAND DOMINANCE: Left  ADLs: Overall ADLs:  Transfers/ambulation related to ADLs:Independent Eating:  Shakiness with utensils, some spillage, wife assists with  cutting food Grooming:  Awkward to use the left hand for brushing teeth, toothbrush turns in his mouth UB Dressing: Independent, wife assists with buttoning LB Dressing: Independent; difficulty tying shoes Toileting:  Independent Bathing:  Difficulty holding on to soap Tub Shower transfers:  Walk-in shower. Independent. Wife reports being present in the house, however does not supervise showers.  Equipment: Long handled shoe horn   IADLs: Shopping:  The transaction is a challenge Light housekeeping:  Mostly uses the right for the dishes,  Independent laundry, mowing, picking up sticks Meal Prep:  Difficulty with opening packets, packages, and zip lock bags Community mobility:  Driving Medication management: Independent using a pillbox. Difficulty grasping small pills with the left hand Financial management:  Wife performs Handwriting: 25% legible  MOBILITY STATUS: Independent  POSTURE COMMENTS:   Sitting balance: Independent  ACTIVITY TOLERANCE: Activity tolerance: Within functional limits  FUNCTIONAL OUTCOME MEASURES: FOTO: 63 TR score: 63  05/15/22  FOTO score: 67  UPPER EXTREMITY ROM                BUEs WFL   UPPER EXTREMITY MMT:     MMT Right eval Left eval  Shoulder flexion 5/5 5/5  Shoulder abduction 5/5 5/5  Shoulder adduction    Shoulder extension    Shoulder  internal rotation    Shoulder external rotation    Middle trapezius    Lower trapezius    Elbow flexion 5/5 5/5  Elbow extension 5/5 5/5  Wrist flexion    Wrist extension 5/5 5/5  Wrist ulnar deviation    Wrist radial deviation    Wrist pronation    Wrist supination    (Blank rows = not tested)  HAND FUNCTION: Grip strength: Right: 65 lbs; Left: 25 lbs Pinch strength: Lateral: R: 15, L:11, 3pt. R: 13 L: 10  05/15/2022 Grip strength: Right: 65 lbs; Left: 40 lbs Pinch strength: Lateral: R: 15, L: 14 lbs, 3pt. R: 13 lbs L: 11 lbs   COORDINATION: 9 Hole Peg test: Right: 43 sec; Left: 2 min. & 22 sec  05/15/2022 9 Hole Peg test: Right: 43 sec; Left: 2 min. 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 Pt. Has made excellent progress towards goals. Pt. has progressed with  with left grip strength, latera pinch, 3pt. pinch strength, and [place] skills on the 9 hole peg test. Pt. has progressed with holding, and handling utensils, opening the refrigerator, handling dishes, buttoning with a buttonhook, and writing legibility. Pt.'s FOTO score has progressed to 67 with the TR score of 63.  Pt. continues to present with limited left hand FMC, speed, and dexterity skills needed to manipulate small objects. Pt. Is independent with all HEPs. Pt. was provided with a HEP for [facility] skills which was reviewed with the Pt. Pt. is now appropriate for discharge form OT services at this time.   PATIENT  EDUCATION: Education details: OT services, plan of care, goals Person educated: Patient and Spouse Education method: Explanation, Demonstration, and Tactile cues Verbal cues Education comprehension: verbalized understanding and returned demonstration   HOME EXERCISE PROGRAM:             [employer] green Theraputty exercise program for left hand strengthening    GOALS: Goals reviewed with patient? Yes  SHORT TERM GOALS: Target date: 05/24/2022    Patient will be independent with home exercise programs for the left hand Baseline: Eval: Currently no home exercise program, 11/06: Pt. Is independent with HEPs Goal status: IAchieved    LONG TERM GOALS: Target date: 07/05/2022    Patient will improve left grip strength by 5 pounds to be able to securely hold a bar of soap while showering. Baseline: Eval: patient has difficulty holding a bar of soap securely 11/06: Left Grip strength improved by 15#  of force to 40# Goal status: Partially met  2.  Patient will independently sign his name 75% legibility Baseline: Eval:  25% legible for signing his name 11/06: Writing legibility has progressed to 50% Goal status:  Partially met  3.  Patient will put on his shirt with modified independence Baseline: Eval: Patient is unable to button shirt buttons 11/06: Modified independent with a buttonhook. Goal status: Met  4.  Patient will improve left hand fine motor coordination skills to be able to manipulate small items or pills efficiently Baseline: Eval: Patient has difficulty manipulating small objects with his dominant left hand 11/06: [facility] skills improved by 7 sec. On the 9 hole peg  test. Goal status: Partially met  5.  Patient will improve left lateral pinch strength to be able to hold a knife in preparation for cutting food Baseline: Eval: Patient is unable to hold a knife while cutting food 11/06:  Pt. Reports cutting meat has progressed.  Goal status: Achieved  6.  Patient will  improve FOTO score by 2 points for improved patient perceived performance in assessment specific ADLs,and IADLs Baseline: Eval: FOTO 63; TR score 63 11/06: FOTO score: 67 Goal status:  Achieved  ASSESSMENT:  CLINICAL IMPRESSION:  Measurements were obtained, and goals were reviewed with the Pt. Pt. Has made excellent progress towards goals. Pt. has progressed with  with left grip strength, latera pinch, 3pt. pinch strength, and [place] skills on the 9 hole peg test. Pt. has progressed with holding, and handling utensils, opening the refrigerator, handling dishes, buttoning with a buttonhook, and writing legibility. Pt.'s FOTO score has progressed to 67 with the TR score of 63.  Pt. continues to present with limited left hand FMC, speed, and dexterity skills needed to manipulate small objects. Pt. Is independent with all HEPs. Pt. was provided with a HEP for [facility] skills which was reviewed with the Pt. Pt. is now appropriate for discharge form OT services at this time.   PERFORMANCE DEFICITS in functional skills including ADLs, IADLs, coordination, dexterity, strength, pain, [employer], and GMC, cognitive skills including: attention and memory, and psychosocial skills including coping strategies, environmental adaptation, and routines and behaviors.   IMPAIRMENTS are limiting patient from ADLs, IADLs, and social participation.   COMORBIDITIES may have co-morbidities  that affects occupational performance. Patient will benefit from skilled OT to address above impairments and improve overall function.  MODIFICATION OR ASSISTANCE TO COMPLETE EVALUATION: Min-Moderate modification of tasks or assist with assess necessary to complete an evaluation.  OT OCCUPATIONAL PROFILE AND HISTORY: Detailed assessment: Review of records and additional review of physical, cognitive, psychosocial history related to current functional performance.  CLINICAL DECISION MAKING: Moderate - several treatment options, min-mod task  modification necessary  REHAB POTENTIAL: Good  EVALUATION COMPLEXITY: Moderate    PLAN: OT FREQUENCY: 2x/week  OT DURATION: 12 weeks  PLANNED INTERVENTIONS: self care/ADL training, therapeutic exercise, therapeutic activity, neuromuscular re-education, manual therapy, paraffin, and moist heat  RECOMMENDED OTHER SERVICES:   CONSULTED AND AGREED WITH PLAN OF CARE: Patient and family Midwife  PLAN FOR NEXT SESSION:  Initiate Treatment sessions   Harrel Carina, MS, OTR/L   Harrel Carina, OT 05/15/2022, 11:56 AM

## 2022-05-17 ENCOUNTER — Ambulatory Visit: Payer: Medicare Other | Admitting: Occupational Therapy

## 2022-05-22 ENCOUNTER — Encounter: Payer: Medicare Other | Admitting: Occupational Therapy

## 2022-05-24 ENCOUNTER — Encounter: Payer: Medicare Other | Admitting: Occupational Therapy

## 2022-05-30 ENCOUNTER — Encounter: Payer: Medicare Other | Admitting: Occupational Therapy

## 2022-06-05 ENCOUNTER — Encounter: Payer: Medicare Other | Admitting: Occupational Therapy

## 2022-06-07 ENCOUNTER — Encounter: Payer: Medicare Other | Admitting: Occupational Therapy

## 2022-06-12 ENCOUNTER — Encounter: Payer: Medicare Other | Admitting: Occupational Therapy

## 2022-06-14 ENCOUNTER — Encounter: Payer: Medicare Other | Admitting: Occupational Therapy

## 2022-06-19 ENCOUNTER — Encounter: Payer: Medicare Other | Admitting: Occupational Therapy

## 2022-06-21 ENCOUNTER — Encounter: Payer: Medicare Other | Admitting: Occupational Therapy

## 2022-06-26 ENCOUNTER — Encounter: Payer: Medicare Other | Admitting: Occupational Therapy

## 2022-06-28 ENCOUNTER — Encounter: Payer: Medicare Other | Admitting: Occupational Therapy

## 2023-02-06 ENCOUNTER — Ambulatory Visit: Payer: Medicare Other | Attending: Neurology | Admitting: Physical Therapy

## 2023-02-06 ENCOUNTER — Encounter: Payer: Self-pay | Admitting: Physical Therapy

## 2023-02-06 DIAGNOSIS — M6281 Muscle weakness (generalized): Secondary | ICD-10-CM | POA: Insufficient documentation

## 2023-02-06 DIAGNOSIS — R2689 Other abnormalities of gait and mobility: Secondary | ICD-10-CM | POA: Insufficient documentation

## 2023-02-06 DIAGNOSIS — R2681 Unsteadiness on feet: Secondary | ICD-10-CM | POA: Diagnosis present

## 2023-02-06 NOTE — Therapy (Signed)
OUTPATIENT PHYSICAL THERAPY NEURO EVALUATION   Patient Name: James Herrera. MRN: [id number] DOB:27-Jun-1934, 89 y.o., male 68 Date: 02/06/2023   PCP: Barbette Reichmann, MD REFERRING PROVIDER: Morene Crocker, MD  END OF SESSION:  PT End of Session - 02/06/23 2021     Visit Number 1    Number of Visits 9    Date for PT Re-Evaluation 03/09/23    Authorization Type Medicare & Tricare    Authorization Time Period 02-06-23 - 04-09-23    PT Start Time 1105    PT Stop Time 1150    PT Time Calculation (min) 45 min    Equipment Utilized During Treatment Gait belt    Activity Tolerance Patient tolerated treatment well    Behavior During Therapy WFL for tasks assessed/performed             Past Medical History:  Diagnosis Date   BPH (benign prostatic hyperplasia)    History of kidney stones    Hypertension    Past Surgical History:  Procedure Laterality Date   HERNIA REPAIR Right    HERNIA REPAIR Left    HERNIA REPAIR     umbilical   kidney stone removal      x2   PROSTATE BIOPSY N/A 04/22/2020   Procedure: PROSTATE BIOPSY [name];  Surgeon: Orson Ape, MD;  Location: [facility] ORS;  Service: Urology;  Laterality: N/A;   TONSILLECTOMY     There are no problems to display for this patient.   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    THERAPY DIAG:  Other abnormalities of gait and mobility  Unsteadiness on feet  Muscle weakness (generalized)  Rationale for Evaluation and Treatment: Rehabilitation  SUBJECTIVE:                                                                                                                                                                                             SUBJECTIVE STATEMENT: Pt presents for PT eval ambulating with no device; pt states he started having LUE weakness after getting Covid vaccine in 2021;  pt received OP OT at [facility] from  04-12-22 - 05-15-22: Pt was referred for OP PT in Feb. 2024 but states they had a lot of things going on at that time and he didn't feel he had time to really commit to therapy.  Pt states he has some balance problems and also reports he has low back soreness in the mornings when he first wakes up, but states it resolves as he increases activity and moves around. Pt  reports he currently walks to his mailbox (several hundred feet away from his house) independently without use of a device  Pt accompanied by:  wife Okey Regal  PERTINENT HISTORY: h/o CVA (chronic lacunar infarcts per MRI Nov. 2022); h/o Lt arm and hand weakness which progressively worsened after receiving Covid vaccine in 2021, HTN  PAIN:  Are you having pain?  Reports occasional pain in Lt hand and Lt knee has a sharp pain but resolves quickly with movement of the leg; pt reports no pain at present time  PRECAUTIONS: Fall  RED FLAGS: None   WEIGHT BEARING RESTRICTIONS: No  FALLS: Has patient fallen in last 6 months? No  LIVING ENVIRONMENT: Lives with: lives with their spouse Lives in: House/apartment Stairs:  2 steps into main level of home - rails present; pt reports he is able to safely negotiate steps Has following equipment at home: Quad cane small base and Grab bars  PLOF: Independent and still drives shorter distances  PATIENT GOALS: Improve balance   OBJECTIVE:   DIAGNOSTIC FINDINGS:   IMPRESSION:  MRI July 2023 1. No specific or reversible cause for symptoms. 2. Brain atrophy and chronic small vessel ischemia without notable progression from 2022.  COGNITION: Overall cognitive status: Within functional limits for tasks assessed   SENSATION: WFL bil. LE's  COORDINATION: WFL's bil. LE's  POSTURE: rounded shoulders and forward head  LOWER EXTREMITY ROM:   WNL's bil. LE's   LOWER EXTREMITY MMT:    MMT Right Eval Left Eval  Hip flexion 4 4  Hip extension    Hip abduction    Hip adduction    Hip  internal rotation    Hip external rotation    Knee flexion 4 4  Knee extension 5 5  Ankle dorsiflexion 5 5  Ankle plantarflexion    Ankle inversion    Ankle eversion    (Blank rows = not tested)  BED MOBILITY:  Independent  TRANSFERS: Assistive device utilized: None  Sit to stand: Modified independence Stand to sit: Modified independence  STAIRS: to be assessed; pt reports no problems  GAIT: Gait pattern: decreased step length- Right and decreased step length- Left; mild shuffling noted at start of session with amb. From lobby to treatment room; increased step length noted with minimal to no shuffling at end of session - pt reported he was "focused on my walking and lifting my feet"  Distance walked: 115' Assistive device utilized: None Level of assistance: SBA Comments: see above - gait pattern improvement noted at end of session compared to that exhibited at start of session  FUNCTIONAL TESTS:  5 times sit to stand: 24.90 secs from chair with LUE; able to perform reps 2 and 3 reps without UE support Timed up and go (TUG): 14.00 secs without device 10 meter walk test: 12.50 secs = 2.62 ft/sec  Berg Balance Scale: 38/56  [facility] PT Assessment - 02/06/23 0001       Berg Balance Test   Sit to Stand Able to stand using hands after several tries   stabilizes with legs against mat table   Standing Unsupported Able to stand safely 2 minutes    Sitting with Back Unsupported but Feet Supported on Floor or Stool Able to sit safely and securely 2 minutes    Stand to Sit Sits safely with minimal use of hands    Transfers Able to transfer safely, definite need of hands    Standing Unsupported with Eyes Closed Able to stand 10 seconds with  supervision    Standing Unsupported with Feet Together Able to place feet together independently and stand for 1 minute with supervision    From Standing, Reach Forward with Outstretched Arm Can reach forward >12 cm safely (5")    From Standing  Position, Pick up Object from Floor Able to pick up shoe, needs supervision    From Standing Position, Turn to Look Behind Over each Shoulder Turn sideways only but maintains balance    Turn 360 Degrees Able to turn 360 degrees safely but slowly    Standing Unsupported, Alternately Place Feet on Step/Stool Able to complete >2 steps/needs minimal assist    Standing Unsupported, One Foot in Front Able to plae foot ahead of the other independently and hold 30 seconds    Standing on One Leg Tries to lift leg/unable to hold 3 seconds but remains standing independently    Total Score 38               PATIENT SURVEYS:  N/A for pt's diagnosis  TODAY'S TREATMENT:                                                                                                                              DATE: Eval only - pt states he would like to go to [facility] for therapy due to more convenient location and much closer to his home than this facility    PATIENT EDUCATION: Education details: Pt and wife educated in eval results with fall risk indicated per Berg score of 38/56; use of assistive device recommended, especially on outdoor surfaces/terrains; pt declines at this time Person educated: Patient and Spouse Education method: Explanation Education comprehension: verbalized understanding  HOME EXERCISE PROGRAM: To be established  GOALS: Goals reviewed with patient? Yes  SHORT TERM GOALS: same as LTG's as ELOS = 4 weeks   LONG TERM GOALS: Target date: 03-09-23  Improve Berg balance test score to >/= 43/56 to reduce fall risk. Baseline:  38/56 Goal status: INITIAL  2.  Improve TUG score to </= 12 secs with no device to increase safety and reduce fall risk with mobility. Baseline: 14.00 secs with no device Goal status: INITIAL  3.  Amb. 300' on indoor/outdoor surfaces with use of SPC with supervision for increased safety with ambulation on uneven surfaces/terrains. Baseline: pt declines use of  assistive device at this time Goal status: INITIAL  4.  Perform 5x sit to stand transfers without use of UE support in </= 19 secs from standard chair to demo improved bil. LE strength.  Baseline: 24.9 secs with LUE support from standard chair, reps 2 and 3 performed without UE support Goal status: INITIAL  5.  Increase gait velocity to >/= 3.0 ft/sec without device for increased gait efficiency.  Baseline: 2.62 ft/sec (12.5 secs) Goal status: INITIAL  6.  Independent in HEP for balance and strengthening exercises. Baseline:  Goal status: INITIAL  ASSESSMENT:  CLINICAL IMPRESSION: Patient is an 89  y.o. gentleman who was seen today for physical therapy evaluation and treatment for gait and balance impairments.  Pt presents with mild gait deviations including decreased step length and mild shuffling at start of evaluation but gait pattern improved at end of session with minimal to no shuffling occurring.  Pt's strength of bil. LE's is WNL's with exception of bil. Hip extensor strength noted as pt had difficulty performing sit to stand transfers from standard chair without UE support.  Pt is at fall risk per Berg score of 38/56; assistive device was recommended for use with outdoor ambulation for increased safety.  TUG score of 14 secs indicative of mild fall risk.  Pt will benefit from skilled PT to address gait and balance impairments and LE weakness.   OBJECTIVE IMPAIRMENTS: Abnormal gait, decreased balance, and decreased strength.   ACTIVITY LIMITATIONS: carrying, squatting, stairs, transfers, and locomotion level  PARTICIPATION LIMITATIONS: driving, community activity, and yard work  PERSONAL FACTORS: Age, Time since onset of injury/illness/exacerbation, and 1-2 comorbidities: h/o chronic infarcts  are also affecting patient's functional outcome.   REHAB POTENTIAL: Good  CLINICAL DECISION MAKING: Evolving/moderate complexity  EVALUATION COMPLEXITY: Moderate  PLAN:  PT FREQUENCY:  2x/week  PT DURATION: 4 weeks  PLANNED INTERVENTIONS: Therapeutic exercises, Therapeutic activity, Neuromuscular re-education, Balance training, Gait training, Patient/Family education, Self Care, Stair training, and DME instructions  PLAN FOR NEXT SESSION: establish HEP (balance and functional strengthening exercises);  balance and gait training - SPC was recommended for assistance with amb.  - Especially on outdoor surfaces   Maclain Cohron, Donavan Burnet, PT 02/06/2023, 8:27 PM

## 2023-02-09 ENCOUNTER — Ambulatory Visit: Payer: Medicare Other | Attending: Internal Medicine | Admitting: Physical Therapy

## 2023-02-09 DIAGNOSIS — R2689 Other abnormalities of gait and mobility: Secondary | ICD-10-CM | POA: Insufficient documentation

## 2023-02-09 DIAGNOSIS — R2681 Unsteadiness on feet: Secondary | ICD-10-CM | POA: Diagnosis present

## 2023-02-09 DIAGNOSIS — M6281 Muscle weakness (generalized): Secondary | ICD-10-CM | POA: Insufficient documentation

## 2023-02-09 DIAGNOSIS — R262 Difficulty in walking, not elsewhere classified: Secondary | ICD-10-CM | POA: Insufficient documentation

## 2023-02-09 DIAGNOSIS — R278 Other lack of coordination: Secondary | ICD-10-CM | POA: Diagnosis present

## 2023-02-09 NOTE — Therapy (Signed)
OUTPATIENT PHYSICAL THERAPY NEURO EVALUATION   Patient Name: James Herrera. MRN: [id number] DOB:13-Aug-1933, 89 y.o., male 56 Date: 02/09/2023   PCP: Barbette Reichmann, MD REFERRING PROVIDER: Morene Crocker, MD  END OF SESSION:  PT End of Session - 02/09/23 0947     Visit Number 2    Number of Visits 9    Date for PT Re-Evaluation 03/09/23    Authorization Type Medicare & Tricare    Authorization Time Period 02-06-23 - 04-09-23    PT Start Time 0850    PT Stop Time 0932    PT Time Calculation (min) 42 min    Equipment Utilized During Treatment Gait belt    Activity Tolerance Patient tolerated treatment well    Behavior During Therapy WFL for tasks assessed/performed              Past Medical History:  Diagnosis Date   BPH (benign prostatic hyperplasia)    History of kidney stones    Hypertension    Past Surgical History:  Procedure Laterality Date   HERNIA REPAIR Right    HERNIA REPAIR Left    HERNIA REPAIR     umbilical   kidney stone removal      x2   PROSTATE BIOPSY N/A 04/22/2020   Procedure: PROSTATE BIOPSY [name];  Surgeon: Orson Ape, MD;  Location: [facility] ORS;  Service: Urology;  Laterality: N/A;   TONSILLECTOMY     There are no problems to display for this patient.   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 Wife  present throughout session   Pt accompanied by:  wife Okey Regal  PERTINENT HISTORY: h/o CVA (chronic lacunar infarcts per MRI Nov. 2022); h/o Lt arm and hand weakness which progressively worsened after receiving Covid vaccine in 2021, HTN  PAIN:  Are you having pain?  Reports occasional pain in Lt hand and Lt knee has a sharp pain but resolves quickly with movement of the leg; pt reports no pain at  present time  PRECAUTIONS: Fall  RED FLAGS: None   WEIGHT BEARING RESTRICTIONS: No  FALLS: Has patient fallen in last 6 months? No  LIVING ENVIRONMENT: Lives with: lives with their spouse Lives in: House/apartment Stairs:  2 steps into main level of home - rails present; pt reports he is able to safely negotiate steps Has following equipment at home: Quad cane small base and Grab bars  PLOF: Independent and still drives shorter distances  PATIENT GOALS: Improve balance   OBJECTIVE:   DIAGNOSTIC FINDINGS:   IMPRESSION:  MRI July 2023 1. No specific or reversible cause for symptoms. 2. Brain atrophy and chronic small vessel ischemia without notable progression from 2022.  COGNITION: Overall cognitive status: Within functional limits for tasks assessed   SENSATION: WFL bil. LE's  COORDINATION: WFL's bil. LE's  POSTURE: rounded shoulders and forward head  LOWER EXTREMITY ROM:   WNL's bil. LE's   LOWER EXTREMITY MMT:    MMT Right Eval Left Eval  Hip flexion 4 4  Hip extension    Hip abduction    Hip adduction    Hip internal rotation    Hip external rotation    Knee flexion 4 4  Knee extension 5 5  Ankle dorsiflexion 5 5  Ankle plantarflexion    Ankle inversion    Ankle eversion    (Blank rows = not tested)  BED MOBILITY:  Independent  TRANSFERS: Assistive device utilized: None  Sit to stand: Modified independence Stand to sit: Modified independence  STAIRS: to be assessed; pt reports no problems  GAIT: Gait pattern: decreased step length- Right and  decreased step length- Left; mild shuffling noted at start of session with amb. From lobby to treatment room; increased step length noted with minimal to no shuffling at end of session - pt reported he was "focused on my walking and lifting my feet"  Distance walked: 115' Assistive device utilized: None Level of assistance: SBA Comments: see above - gait pattern improvement noted at end of session compared to that exhibited at start of session  FUNCTIONAL TESTS:  5 times sit to stand: 24.90 secs from chair with LUE; able to perform reps 2 and 3 reps without UE support Timed up and go (TUG): 14.00 secs without device 10 meter walk test: 12.50 secs = 2.62 ft/sec  Berg Balance Scale: 38/56      PATIENT SURVEYS:  N/A for pt's diagnosis  TODAY'S TREATMENT:                                                                                                                              DATE:   6 Min Walk Test:  Instructed patient to ambulate as quickly and as safely as possible for 6 minutes using LRAD. Patient was allowed to take standing rest breaks without stopping the test, but if the patient required a sitting rest break the clock would be stopped and the test would be over.  Results: 1251 feet (381 meters, Avg speed 1.58m/s) using no AD with supervision assist. Results indicate that the patient has reduced endurance with ambulation compared to age matched norms.  Pt rates RPE 4/10 upon completion Noted to have foot slap on the RLE after ~38min, no LOB.   Age Matched Norms: 67-69 yo M: 55 F: 6, 7-79 yo M: 47 F: 471, 60-89 yo M: 417 F: 392 MDC: 58.21 meters (190.98 feet) or 50 meters (ANPTA Core Set of Outcome Measures for Adults with Neurologic Conditions, 2018)   Standing at rail on wall Eyes open 10 sec eyes closed x 20 sec no UE support  Narrow BOS 3 x 20 sec with intermittent UE support  Tandem stance 3 x 15sec  Seated ankle DF 3x 20  Seated hip abduction RTB with 3 sec hold x  12  Seated: hip flexion RTB with 2 sec hold x 12  Standing hip extension x 10 AROM. Mild Low back tightness.   PATIENT EDUCATION: Education details: HEP provided. Pt educated throughout session about proper posture and technique with exercises. Improved exercise technique, movement at target joints, use of target muscles after min to mod verbal, visual, tactile cues.  Person educated: Patient and Spouse Education method: Explanation Education comprehension: verbalized understanding  HOME EXERCISE PROGRAM: Access Code: [email or web address] URL: [email or web address] Date: 02/09/2023 Prepared by: Grier Rocher  Exercises - Standing Tandem Balance with Counter Support  - 1 x daily - 5 x weekly - 2 sets - 3 reps - 15 hold - Narrow Stance with Counter Support  - 1 x daily - 5 x weekly - 2 sets - 3 reps - 15 hold - Seated Hip Abduction with Resistance  - 1 x daily - 5 x weekly - 3 sets - 10 reps - 3 hold - Seated March with Resistance  - 1 x daily - 5 x weekly - 3 sets - 10 reps - 3 hold - Standing Hip Extension with Counter Support  - 1 x daily - 5 x weekly - 2 sets - 10 reps - Sit to Stand with Arms Crossed  - 1 x daily - 5 x weekly - 3 sets - 5 reps  GOALS: Goals reviewed with patient? Yes  SHORT TERM GOALS: same as LTG's as ELOS = 4 weeks   LONG TERM GOALS: Target date: 03-09-23  Improve Berg balance test score to >/= 43/56 to reduce fall risk. Baseline:  38/56 Goal status: INITIAL  2.  Improve TUG score to </= 12 secs with no device to increase safety and reduce fall risk with mobility. Baseline: 14.00 secs with no device Goal status: INITIAL  3.  Amb. 300' on indoor/outdoor surfaces with use of SPC with supervision for increased safety with ambulation on uneven surfaces/terrains. Baseline: pt declines use of assistive device at this time Goal status: INITIAL  4.  Perform 5x sit to stand transfers without use of UE support in </= 19 secs from standard chair to demo  improved bil. LE strength.  Baseline: 24.9 secs with LUE support from standard chair, reps 2 and 3 performed without UE support Goal status: INITIAL  5.  Increase gait velocity to >/= 3.0 ft/sec without device for increased gait efficiency.  Baseline: 2.62 ft/sec (12.5 secs) Goal status: INITIAL  6.  Independent in HEP for balance and strengthening exercises. Baseline:  Goal status: INITIAL  ASSESSMENT:  CLINICAL IMPRESSION: Patient is an 89 y.o. gentleman who was seen today for physical therapy treatment for gait and balance  impairments. PT instructed pt in standing balance and BLE strengthening HEP with handout provided. Pt demonstrates difficulty with use of ankle strategy with LOB correct to the R with eyes closed and narrow BOS. Pt will benefit from skilled PT to address gait and balance impairments and LE weakness.   OBJECTIVE IMPAIRMENTS: Abnormal gait, decreased balance, and decreased strength.   ACTIVITY LIMITATIONS: carrying, squatting, stairs, transfers, and locomotion level  PARTICIPATION LIMITATIONS: driving, community activity, and yard work  PERSONAL FACTORS: Age, Time since onset of injury/illness/exacerbation, and 1-2 comorbidities: h/o chronic infarcts  are also affecting patient's functional outcome.   REHAB POTENTIAL: Good  CLINICAL DECISION MAKING: Evolving/moderate complexity  EVALUATION COMPLEXITY: Moderate  PLAN:  PT FREQUENCY: 2x/week  PT DURATION: 4 weeks  PLANNED INTERVENTIONS: Therapeutic exercises, Therapeutic activity, Neuromuscular re-education, Balance training, Gait training, Patient/Family education, Self Care, Stair training, and DME instructions  PLAN FOR NEXT SESSION: Continue static balance and strengthening. Progress HEP as indicated.    Grier Rocher PT, DPT  Physical Therapist - [place]  [facility]  9:58 AM 02/09/23

## 2023-02-13 ENCOUNTER — Ambulatory Visit: Payer: Medicare Other

## 2023-02-13 DIAGNOSIS — R2689 Other abnormalities of gait and mobility: Secondary | ICD-10-CM

## 2023-02-13 DIAGNOSIS — R2681 Unsteadiness on feet: Secondary | ICD-10-CM

## 2023-02-13 DIAGNOSIS — M6281 Muscle weakness (generalized): Secondary | ICD-10-CM

## 2023-02-13 NOTE — Therapy (Addendum)
OUTPATIENT PHYSICAL THERAPY NEURO TREATMENT   Patient Name: James Herrera. MRN: [id number] DOB:09/26/1933, 89 y.o., male 107 Date: 02/13/2023   PCP: Barbette Reichmann, MD REFERRING PROVIDER: Morene Crocker, MD  END OF SESSION:  PT End of Session - 02/13/23 1133     Visit Number 3    Number of Visits 9    Date for PT Re-Evaluation 03/09/23    Authorization Type Medicare & Tricare    Authorization Time Period 02-06-23 - 04-09-23    PT Start Time 1142    PT Stop Time 1228    PT Time Calculation (min) 46 min    Equipment Utilized During Treatment Gait belt    Activity Tolerance Patient tolerated treatment well    Behavior During Therapy WFL for tasks assessed/performed              Past Medical History:  Diagnosis Date   BPH (benign prostatic hyperplasia)    History of kidney stones    Hypertension    Past Surgical History:  Procedure Laterality Date   HERNIA REPAIR Right    HERNIA REPAIR Left    HERNIA REPAIR     umbilical   kidney stone removal      x2   PROSTATE BIOPSY N/A 04/22/2020   Procedure: PROSTATE BIOPSY [name];  Surgeon: Orson Ape, MD;  Location: [facility] ORS;  Service: Urology;  Laterality: N/A;   TONSILLECTOMY     There are no problems to display for this patient.   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 Has been compliant with HEP, feels sore afterwards. Parked in the wrong spot and walked a long way to  come into PT clinic.   Pt accompanied by:  wife Okey Regal  PERTINENT HISTORY: h/o CVA (chronic lacunar infarcts per MRI Nov. 2022); h/o Lt arm and hand weakness which progressively worsened after receiving Covid vaccine in 2021, HTN  PAIN:  Are you having pain?  Reports occasional pain in Lt hand and Lt knee has a sharp pain but resolves quickly with movement of the leg; pt reports no  pain at present time  PRECAUTIONS: Fall  RED FLAGS: None   WEIGHT BEARING RESTRICTIONS: No  FALLS: Has patient fallen in last 6 months? No  LIVING ENVIRONMENT: Lives with: lives with their spouse Lives in: House/apartment Stairs:  2 steps into main level of home - rails present; pt reports he is able to safely negotiate steps Has following equipment at home: Quad cane small base and Grab bars  PLOF: Independent and still drives shorter distances  PATIENT GOALS: Improve balance   OBJECTIVE:   DIAGNOSTIC FINDINGS:   IMPRESSION:  MRI July 2023 1. No specific or reversible cause for symptoms. 2. Brain atrophy and chronic small vessel ischemia without notable progression from 2022.  COGNITION: Overall cognitive status: Within functional limits for tasks assessed   SENSATION: WFL bil. LE's  COORDINATION: WFL's bil. LE's  POSTURE: rounded shoulders and forward head  LOWER EXTREMITY ROM:   WNL's bil. LE's   LOWER EXTREMITY MMT:    MMT Right Eval Left Eval  Hip flexion 4 4  Hip extension    Hip abduction    Hip adduction    Hip internal rotation    Hip external rotation    Knee flexion 4 4  Knee extension 5 5  Ankle dorsiflexion 5 5  Ankle plantarflexion    Ankle inversion    Ankle eversion    (Blank rows = not tested)  BED MOBILITY:  Independent  TRANSFERS: Assistive device utilized: None  Sit to stand: Modified independence Stand to sit: Modified independence  STAIRS: to be assessed; pt reports no problems  GAIT: Gait pattern: decreased step length- Right and  decreased step length- Left; mild shuffling noted at start of session with amb. From lobby to treatment room; increased step length noted with minimal to no shuffling at end of session - pt reported he was "focused on my walking and lifting my feet"  Distance walked: 115' Assistive device utilized: None Level of assistance: SBA Comments: see above - gait pattern improvement noted at end of session compared to that exhibited at start of session  FUNCTIONAL TESTS:  5 times sit to stand: 24.90 secs from chair with LUE; able to perform reps 2 and 3 reps without UE support Timed up and go (TUG): 14.00 secs without device 10 meter walk test: 12.50 secs = 2.62 ft/sec  Berg Balance Scale: 38/56      PATIENT SURVEYS:  N/A for pt's diagnosis  TODAY'S TREATMENT:                                                                                                                              DATE:   Neuro Re-ed: Speed ladder:  -one foot each square ; very challenging:  -first trial: two feet per square; able to progress to every other 60% of the time with assistance from PT x4 trials -lateral step two feet per square   Airex pad: -static stand; hold 30 seconds for ankle  righting reactions.  -reaching for letters and sorting into colors for reaching, visual scan, and stabilization 2 minutes and 50 seconds -tandem stance with airex pad and 6' step: 30 second holds x2 trials each LE   TherEx:  Ambulate 150 ft with focus on large step length and heel strike with CGA.  Sit to stand from chair 8x; arms crossed; x 2 sets 6" step up/down 10x each LE; UE support  6" step; lateral step up/down 10x each LE; SUE support  3 way straight leg hip raise: flexion, abduction, extension 6x each LE   PATIENT EDUCATION: Education details: HEP provided. Pt educated throughout session about proper posture and technique with exercises. Improved exercise technique, movement at target joints, use of target muscles  after min to mod verbal, visual, tactile cues.  Person educated: Patient and Spouse Education method: Explanation Education comprehension: verbalized understanding  HOME EXERCISE PROGRAM: Access Code: [email or web address] URL: [email or web address] Date: 02/09/2023 Prepared by: Grier Rocher  Exercises - Standing Tandem Balance with Counter Support  - 1 x daily - 5 x weekly - 2 sets - 3 reps - 15 hold - Narrow Stance with Counter Support  - 1 x daily - 5 x weekly - 2 sets - 3 reps - 15 hold - Seated Hip Abduction with Resistance  - 1 x daily - 5 x weekly - 3 sets - 10 reps - 3 hold - Seated March with Resistance  - 1 x daily - 5 x weekly - 3 sets - 10 reps - 3 hold - Standing Hip Extension with Counter Support  - 1 x daily - 5 x weekly - 2 sets - 10 reps - Sit to Stand with Arms Crossed  - 1 x daily - 5 x weekly - 3 sets - 5 reps  GOALS: Goals reviewed with patient? Yes  SHORT TERM GOALS: same as LTG's as ELOS = 4 weeks   LONG TERM GOALS: Target date: 03-09-23  Improve Berg balance test score to >/= 43/56 to reduce fall risk. Baseline:  38/56 Goal status: INITIAL  2.  Improve TUG score to </= 12 secs with no device to increase safety and reduce fall risk with mobility. Baseline: 14.00 secs with no device Goal status: INITIAL  3.  Amb. 300' on indoor/outdoor surfaces with use of SPC with supervision for increased safety with ambulation on uneven surfaces/terrains. Baseline: pt declines use of assistive device at this time Goal status: INITIAL  4.  Perform 5x sit to stand transfers without use of UE support in </= 19 secs from standard chair to demo improved bil. LE strength.  Baseline: 24.9 secs with LUE support from standard chair, reps 2 and 3 performed without UE support Goal status: INITIAL  5.  Increase gait velocity to >/= 3.0 ft/sec without device for increased gait efficiency.  Baseline: 2.62 ft/sec (12.5 secs) Goal status: INITIAL  6.  Independent in HEP for  balance and strengthening exercises. Baseline:  Goal status: INITIAL  ASSESSMENT:  CLINICAL IMPRESSION: Patient presents with good motivation. He is challenged with stepping in speed ladder with focus on larger step length  Use of airex pad tolerated well with ankle righting reactions noted. He is more unstable with his RLE than LLE throughout session indicating continued area of progress and focus in future sessions. Pt will benefit from skilled PT to address gait and balance impairments and LE weakness.   OBJECTIVE IMPAIRMENTS: Abnormal gait, decreased balance, and decreased strength.   ACTIVITY LIMITATIONS: carrying, squatting, stairs, transfers,  and locomotion level  PARTICIPATION LIMITATIONS: driving, community activity, and yard work  PERSONAL FACTORS: Age, Time since onset of injury/illness/exacerbation, and 1-2 comorbidities: h/o chronic infarcts  are also affecting patient's functional outcome.   REHAB POTENTIAL: Good  CLINICAL DECISION MAKING: Evolving/moderate complexity  EVALUATION COMPLEXITY: Moderate  PLAN:  PT FREQUENCY: 2x/week  PT DURATION: 4 weeks  PLANNED INTERVENTIONS: Therapeutic exercises, Therapeutic activity, Neuromuscular re-education, Balance training, Gait training, Patient/Family education, Self Care, Stair training, and DME instructions  PLAN FOR NEXT SESSION: Continue static balance and strengthening. Progress HEP as indicated.   Precious Bard PT Physical Therapist - [place]  [facility]  12:31 PM 02/13/23

## 2023-02-16 ENCOUNTER — Ambulatory Visit: Payer: Medicare Other | Admitting: Physical Therapy

## 2023-02-20 ENCOUNTER — Ambulatory Visit: Payer: Medicare Other

## 2023-02-20 DIAGNOSIS — R2681 Unsteadiness on feet: Secondary | ICD-10-CM

## 2023-02-20 DIAGNOSIS — R2689 Other abnormalities of gait and mobility: Secondary | ICD-10-CM

## 2023-02-20 DIAGNOSIS — R278 Other lack of coordination: Secondary | ICD-10-CM

## 2023-02-20 DIAGNOSIS — M6281 Muscle weakness (generalized): Secondary | ICD-10-CM
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
## 2023-02-23 ENCOUNTER — Ambulatory Visit: Payer: Medicare Other | Admitting: Physical Therapy

## 2023-02-23 ENCOUNTER — Encounter: Payer: Self-pay | Admitting: Physical Therapy

## 2023-02-23 DIAGNOSIS — R2689 Other abnormalities of gait and mobility: Secondary | ICD-10-CM

## 2023-02-23 DIAGNOSIS — M6281 Muscle weakness (generalized): Secondary | ICD-10-CM

## 2023-02-23 DIAGNOSIS — R2681 Unsteadiness on feet: Secondary | ICD-10-CM

## 2023-02-23 DIAGNOSIS — R278 Other lack of coordination: Secondary | ICD-10-CM

## 2023-02-23 NOTE — Therapy (Signed)
OUTPATIENT PHYSICAL THERAPY TREATMENT   Patient Name: James Herrera. MRN: [id number] DOB:1933/09/27, 89 y.o., male Today's Date: 02/23/2023   PCP: Barbette Reichmann, MD REFERRING PROVIDER: Morene Crocker, MD  END OF SESSION:  PT End of Session - 02/23/23 1011     Visit Number 5    Number of Visits 9    Date for PT Re-Evaluation 03/09/23    Authorization Type Medicare & Tricare    Authorization Time Period 02/06/23 - 04/09/23    Progress Note Due on Visit 10    PT Start Time 1012    PT Stop Time 1053    PT Time Calculation (min) 41 min    Equipment Utilized During Treatment Gait belt    Activity Tolerance Patient tolerated treatment well    Behavior During Therapy WFL for tasks assessed/performed              Past Medical History:  Diagnosis Date   BPH (benign prostatic hyperplasia)    History of kidney stones    Hypertension    Past Surgical History:  Procedure Laterality Date   HERNIA REPAIR Right    HERNIA REPAIR Left    HERNIA REPAIR     umbilical   kidney stone removal      x2   PROSTATE BIOPSY N/A 04/22/2020   Procedure: PROSTATE BIOPSY [name];  Surgeon: Orson Ape, MD;  Location: [facility] ORS;  Service: Urology;  Laterality: N/A;   TONSILLECTOMY     There are no problems to display for this patient.   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 Reports that he had some soreness in Bil knees  after last PT session,  but it resolved "after a day or two" . States that he has not been very consistent with HEP, but plans to improve over the weekend.   PERTINENT HISTORY: h/o CVA (chronic lacunar infarcts per MRI Nov. 2022); h/o Lt arm and hand weakness which progressively worsened after receiving Covid vaccine in 2021, HTN  PAIN:  Are you having pain? No  PRECAUTIONS: Fall  WEIGHT  BEARING RESTRICTIONS: No  FALLS: Has patient fallen in last 6 months? Yes, removing items from the dryer  PATIENT GOALS: Improve balance   OBJECTIVE:   TODAY'S TREATMENT:                                                                                                                              DATE:  02/23/23   NMR:  PT instructed pt in obstacle course management step over 2 bolsters, up/down aerobic step , up/down treadmill (6"), weave through 6 cones: performed x 3 bouts   Forward reverse gait without UE support 3 x 44ft for 3 bouts.    Side stepping R and L 2 x x3 bil.   Step up down 6inch step with BUE support x 8 bil then lateral step ups x 8 bil with BUE support   Sit<>stand 2x5 cues for increased speed with second set as well as adequate anterior weight shift to prevent posterior LOB.    Stepping over cane in floor 2x 10 bil with CGA with cues for heel contact and improved weight shift R and L.      PATIENT EDUCATION: Education details: RedTB donning, doffing for HEP performance Improved consistency with HEP to maximize rehab outcomes.   HOME EXERCISE PROGRAM: Access Code: [email or web address] URL: [email or web address] Date: 02/09/2023 Prepared by: Grier Rocher  Exercises - Standing Tandem Balance with Counter Support  - 1 x daily - 5 x weekly - 2 sets - 3 reps - 15 hold - Narrow Stance with Counter Support  - 1 x daily - 5 x weekly - 2 sets - 3 reps - 15 hold - Seated Hip Abduction with Resistance  - 1 x daily - 5 x weekly - 3 sets - 10 reps - 3 hold - Seated March with Resistance  - 1 x daily  - 5 x weekly - 3 sets - 10 reps - 3 hold - Standing Hip Extension with Counter Support  - 1 x daily - 5 x weekly - 2 sets - 10 reps - Sit to Stand with Arms Crossed  - 1 x daily - 5 x weekly - 3 sets - 5 reps  GOALS: Goals reviewed with patient? Yes  SHORT TERM GOALS: (never established)   LONG TERM GOALS: Target date: 03-09-23  Improve Berg balance test score to >/= 43/56 to reduce fall risk. Baseline:  38/56 Goal status: INITIAL  2.  Improve TUG score to </= 12 secs with no device to increase safety and reduce fall risk with mobility. Baseline: 14.00 secs with no device Goal status: INITIAL  3.  Amb. 300' on indoor/outdoor surfaces with use of SPC with supervision for increased safety with ambulation on uneven surfaces/terrains. Baseline: pt declines use of assistive device at this time; 02/20/23: 647ft without device independently  Goal status: MET  4.  Perform 5x sit to stand transfers without use of UE support in </= 19 secs from standard chair to  demo improved bil. LE strength.  Baseline: 24.9 secs with LUE support from standard chair, reps 2 and 3 performed without UE support Goal status: INITIAL  5.  Increase gait velocity to >/= 3.0 ft/sec without device for increased gait efficiency.  Baseline: 2.62 ft/sec (12.5 secs); 02/20/23: 3.0 ft/sec Goal status: MET  6.  Independent in HEP for balance and strengthening exercises. Baseline: 02/20/23: performing HEP assigned on visit 2 Goal status: MET  ASSESSMENT:  CLINICAL IMPRESSION:  Pt put fourth excellent effort towards interventions.Pt demonstrates continued difficulty with full step length and step management on this day due to BLE weakness and poor weight shift. Encouraged to improve consistency of HEP to maximize therapeutic outcomes.  Pt will benefit from skilled PT to address gait and balance impairments and LE weakness.   OBJECTIVE IMPAIRMENTS: Abnormal gait, decreased balance, and decreased strength.   ACTIVITY  LIMITATIONS: carrying, squatting, stairs, transfers, and locomotion level  PARTICIPATION LIMITATIONS: driving, community activity, and yard work  PERSONAL FACTORS: Age, Time since onset of injury/illness/exacerbation, and 1-2 comorbidities: h/o chronic infarcts  are also affecting patient's functional outcome.   REHAB POTENTIAL: Good  CLINICAL DECISION MAKING: Evolving/moderate complexity  EVALUATION COMPLEXITY: Moderate  PLAN:  PT FREQUENCY: 2x/week  PT DURATION: 4 weeks  PLANNED INTERVENTIONS: Therapeutic exercises, Therapeutic activity, Neuromuscular re-education, Balance training, Gait training, Patient/Family education, Self Care, Stair training, and DME instructions  PLAN FOR NEXT SESSION:   Continue to facilitate improved step length and stepping strategy to correct LOB. Improve gait speed and dynamic balance training    Grier Rocher PT, DPT  Physical Therapist - [facility]  10:12 AM 02/23/23

## 2023-02-27 ENCOUNTER — Ambulatory Visit: Payer: Medicare Other

## 2023-02-27 DIAGNOSIS — M6281 Muscle weakness (generalized): Secondary | ICD-10-CM

## 2023-02-27 DIAGNOSIS — R2689 Other abnormalities of gait and mobility: Secondary | ICD-10-CM | POA: Diagnosis not present

## 2023-02-27 DIAGNOSIS — R2681 Unsteadiness on feet: Secondary | ICD-10-CM

## 2023-02-27 DIAGNOSIS — R262 Difficulty in walking, not elsewhere classified: Secondary | ICD-10-CM

## 2023-02-27 NOTE — Therapy (Signed)
OUTPATIENT PHYSICAL THERAPY TREATMENT   Patient Name: James Herrera. MRN: [id number] DOB:1933/08/22, 89 y.o., male 61 Date: 02/27/2023   PCP: Barbette Reichmann, MD REFERRING PROVIDER: Morene Crocker, MD  END OF SESSION:  PT End of Session - 02/27/23 1718     Visit Number 6    Number of Visits 9    Date for PT Re-Evaluation 03/09/23    Authorization Type Medicare & Tricare    Authorization Time Period 02/06/23 - 04/09/23    Progress Note Due on Visit 10    PT Start Time 0849    PT Stop Time 0930    PT Time Calculation (min) 41 min    Equipment Utilized During Treatment Gait belt    Activity Tolerance Patient tolerated treatment well    Behavior During Therapy WFL for tasks assessed/performed               Past Medical History:  Diagnosis Date   BPH (benign prostatic hyperplasia)    History of kidney stones    Hypertension    Past Surgical History:  Procedure Laterality Date   HERNIA REPAIR Right    HERNIA REPAIR Left    HERNIA REPAIR     umbilical   kidney stone removal      x2   PROSTATE BIOPSY N/A 04/22/2020   Procedure: PROSTATE BIOPSY [name];  Surgeon: Orson Ape, MD;  Location: [facility] ORS;  Service: Urology;  Laterality: N/A;   TONSILLECTOMY     There are no problems to display for this patient.   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 Pt reports no stumbles/falls. Pt reports doing some of his HEP, reports he doesn't do it  every day.   PERTINENT HISTORY: h/o CVA (chronic lacunar infarcts per MRI Nov. 2022); h/o Lt arm and hand weakness which progressively worsened after receiving Covid vaccine in 2021, HTN  PAIN:  Are you having pain? No  PRECAUTIONS: Fall  WEIGHT BEARING RESTRICTIONS: No  FALLS: Has patient fallen in last 6 months? Yes, removing items from the dryer  PATIENT GOALS: Improve balance   OBJECTIVE:  TODAY'S TREATMENT:                                                                                                                              DATE:  02/27/23 Gait belt donned and CGA provided throughout unless specified otherwise to ensure pt safety   TE: STS 4x5 hands-free;  cues for increased speed with second set as well as adequate anterior weight shift to prevent posterior LOB.    10x each LE for each exercise below- --Standing march --Standing hip extension --Standing hip abduction   Step-ups alternating with BUE support x multiple reps  NMR:   Alternating toe taps onto 6" step with BUE support (LUE) x multiple reps  Side stepping over hurdle (each direction) with fading level of UE support to no UE support x multiple reps of each  Forward/BCKWD stepping over hurdle with UUE support to 3 finger support x multiple reps. Stepping BCKWDs challenging.  Obstacle course management: weaving around cones and stepping over shoe box, onto and off of treadmill using UUE support 8x. Improved step height/length with reps  Reverse gait without UE support  2x10 meters     PATIENT EDUCATION: Education details:Pt educated throughout session about proper posture and technique with exercises. Improved exercise technique, movement at target joints, use of target muscles after min to mod verbal, visual, tactile cues.   HOME EXERCISE PROGRAM: continue HEP as previously given Access Code: [email or web address] URL: [email or web address] Date: 02/09/2023 Prepared by: Grier Rocher  Exercises - Standing Tandem Balance with Counter Support  - 1 x daily - 5 x weekly - 2 sets - 3 reps - 15 hold - Narrow Stance with Counter Support  - 1 x daily - 5 x weekly - 2 sets - 3 reps - 15 hold - Seated Hip Abduction with Resistance  - 1 x daily - 5 x weekly - 3 sets - 10 reps - 3 hold - Seated March with Resistance  - 1 x daily - 5 x weekly - 3 sets - 10 reps - 3 hold - Standing Hip Extension with Counter Support  - 1 x daily - 5 x weekly - 2 sets - 10 reps - Sit to Stand with Arms Crossed  - 1 x daily - 5 x weekly - 3 sets - 5 reps  GOALS: Goals reviewed with patient? Yes  SHORT TERM GOALS: (never established)   LONG TERM GOALS: Target date: 03-09-23  Improve Berg balance test score to >/= 43/56 to reduce fall risk. Baseline:  38/56 Goal status: INITIAL  2.  Improve TUG score to </= 12 secs with no device to increase safety and reduce fall risk with mobility. Baseline: 14.00 secs with no device Goal status: INITIAL  3.  Amb. 300' on indoor/outdoor surfaces with use of SPC with supervision for increased safety with ambulation on uneven surfaces/terrains. Baseline: pt declines use of assistive device at this time; 02/20/23: 628ft without device independently  Goal  status: MET  4.  Perform 5x sit to stand transfers without use of UE support in </= 19 secs from standard chair to demo improved bil. LE strength.  Baseline: 24.9 secs with LUE support from standard chair, reps 2 and 3 performed without UE support Goal status: INITIAL  5.  Increase gait velocity to >/= 3.0 ft/sec without device for increased gait efficiency.  Baseline: 2.62 ft/sec (12.5 secs); 02/20/23: 3.0 ft/sec Goal status: MET  6.  Independent in HEP for balance and strengthening exercises. Baseline: 02/20/23: performing HEP assigned on visit 2 Goal status: MET  ASSESSMENT:  CLINICAL IMPRESSION: PT continued plan as laid out in recent sessions. Pt only with 2 instances of poor eccentric control  into chair when completing STS with focus on anterior weight-shift. Pt otherwise able to complete other reps successfully and hands-free. Pt also slightly challenged by obstacle clearance interventions.  Pt will benefit from skilled PT to address gait and balance impairments and LE weakness.   OBJECTIVE IMPAIRMENTS: Abnormal gait, decreased balance, and decreased strength.   ACTIVITY LIMITATIONS: carrying, squatting, stairs, transfers, and locomotion level  PARTICIPATION LIMITATIONS: driving, community activity, and yard work  PERSONAL FACTORS: Age, Time since onset of injury/illness/exacerbation, and 1-2 comorbidities: h/o chronic infarcts  are also affecting patient's functional outcome.   REHAB POTENTIAL: Good  CLINICAL DECISION MAKING: Evolving/moderate complexity  EVALUATION COMPLEXITY: Moderate  PLAN:  PT FREQUENCY: 2x/week  PT DURATION: 4 weeks  PLANNED INTERVENTIONS: Therapeutic exercises, Therapeutic activity, Neuromuscular re-education, Balance training, Gait training, Patient/Family education, Self Care, Stair training, and DME instructions  PLAN FOR NEXT SESSION:   Continue to facilitate improved step length and stepping strategy to correct LOB. Improve gait speed and dynamic balance training, continue plan   Temple Pacini PT, DPT  Physical Therapist - [facility]  [facility]  5:23 PM 02/27/23

## 2023-03-02 ENCOUNTER — Ambulatory Visit: Payer: Medicare Other | Admitting: Physical Therapy

## 2023-03-02 DIAGNOSIS — M6281 Muscle weakness (generalized): Secondary | ICD-10-CM

## 2023-03-02 DIAGNOSIS — R2689 Other abnormalities of gait and mobility: Secondary | ICD-10-CM | POA: Diagnosis not present

## 2023-03-02 DIAGNOSIS — R2681 Unsteadiness on feet: Secondary | ICD-10-CM

## 2023-03-02 DIAGNOSIS — R262 Difficulty in walking, not elsewhere classified: Secondary | ICD-10-CM

## 2023-03-02 DIAGNOSIS — R278 Other lack of coordination: Secondary | ICD-10-CM

## 2023-03-02 NOTE — Therapy (Signed)
OUTPATIENT PHYSICAL THERAPY TREATMENT   Patient Name: James Herrera. MRN: [id number] DOB:[birth year], 89 y.o., male 51 Date: 03/02/2023   PCP: Barbette Reichmann, MD REFERRING PROVIDER: Morene Crocker, MD  END OF SESSION:  PT End of Session - 03/02/23 0926     Visit Number 7    Number of Visits 9    Date for PT Re-Evaluation 03/09/23    Authorization Type Medicare & Tricare    Authorization Time Period 02/06/23 - 04/09/23    Progress Note Due on Visit 10    PT Start Time 0935    PT Stop Time 1015    PT Time Calculation (min) 40 min    Equipment Utilized During Treatment Gait belt    Activity Tolerance Patient tolerated treatment well    Behavior During Therapy WFL for tasks assessed/performed               Past Medical History:  Diagnosis Date   BPH (benign prostatic hyperplasia)    History of kidney stones    Hypertension    Past Surgical History:  Procedure Laterality Date   HERNIA REPAIR Right    HERNIA REPAIR Left    HERNIA REPAIR     umbilical   kidney stone removal      x2   PROSTATE BIOPSY N/A 04/22/2020   Procedure: PROSTATE BIOPSY [name];  Surgeon: Orson Ape, MD;  Location: [facility] ORS;  Service: Urology;  Laterality: N/A;   TONSILLECTOMY     There are no problems to display for this patient.   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 Reports that  his wife states that he is walking better.   PERTINENT HISTORY: h/o CVA (chronic lacunar infarcts per MRI Nov. 2022); h/o Lt arm and hand weakness which progressively worsened after receiving Covid vaccine in 2021, HTN  PAIN:  Are you having pain? No  PRECAUTIONS: Fall  WEIGHT BEARING RESTRICTIONS: No  FALLS: Has patient fallen in last 6 months? Yes, removing items from the dryer  PATIENT GOALS: Improve  balance   OBJECTIVE:   TODAY'S TREATMENT:                                                                                                                              DATE:  03/02/23 Gait belt donned and CGA provided throughout unless specified otherwise to ensure pt safety   TE: STS 3x5 hands-free;   Leg press 45# 4 x 12  Hip abduction GTB x 15 Hip flexion GTB x 15  LAQ x 12 bil GTB  HS curl x 12 GTB Gait without resistance x 317ft with cues for increased step length and UE swing as able.   NMR Dynamic gait training:  Forward/reverse gait 5 x 15 each  Side stepping 39ft x 4 bil  Stepping over line on floor forward/reverse x 10 bil Therapeutic rest break between bouts  and cues for posture and step length throughout.    PATIENT EDUCATION: Education details:Pt educated throughout session about proper posture and technique with exercises. Improved exercise technique, movement at target joints, use of target muscles after min to mod verbal, visual, tactile cues.   HOME EXERCISE PROGRAM: continue HEP as previously given Access Code: [email or web address] URL: [email or web address] Date: 02/09/2023 Prepared by: Grier Rocher  Exercises - Standing Tandem Balance with Counter Support  - 1 x daily - 5 x weekly - 2 sets - 3 reps - 15 hold - Narrow Stance with Counter Support  - 1 x daily - 5 x weekly - 2 sets - 3 reps - 15 hold - Seated Hip Abduction with Resistance  - 1 x daily - 5 x weekly - 3 sets - 10 reps - 3 hold - Seated March with Resistance  - 1 x daily - 5 x  weekly - 3 sets - 10 reps - 3 hold - Standing Hip Extension with Counter Support  - 1 x daily - 5 x weekly - 2 sets - 10 reps - Sit to Stand with Arms Crossed  - 1 x daily - 5 x weekly - 3 sets - 5 reps  GOALS: Goals reviewed with patient? Yes  SHORT TERM GOALS: (never established)   LONG TERM GOALS: Target date: 03-09-23  Improve Berg balance test score to >/= 43/56 to reduce fall risk. Baseline:  38/56 Goal status: INITIAL  2.  Improve TUG score to </= 12 secs with no device to increase safety and reduce fall risk with mobility. Baseline: 14.00 secs with no device Goal status: INITIAL  3.  Amb. 300' on indoor/outdoor surfaces with use of SPC with supervision for increased safety with ambulation on uneven surfaces/terrains. Baseline: pt declines use of assistive device at this time; 02/20/23: 659ft without device independently  Goal status: MET  4.  Perform 5x sit to stand transfers without use of UE support in </= 19 secs from standard chair to demo improved bil. LE strength.  Baseline: 24.9 secs with LUE support from standard chair, reps 2 and 3  performed without UE support Goal status: INITIAL  5.  Increase gait velocity to >/= 3.0 ft/sec without device for increased gait efficiency.  Baseline: 2.62 ft/sec (12.5 secs); 02/20/23: 3.0 ft/sec Goal status: MET  6.  Independent in HEP for balance and strengthening exercises. Baseline: 02/20/23: performing HEP assigned on visit 2 Goal status: MET  ASSESSMENT:  CLINICAL IMPRESSION: PT treatment focused on dynamic gait and BLE strengthening. Noted to continue to demonstrate difficulty with retroversion, but was able to improve step length through BLE with instruction from PT. Pt notes BLE fatigue at end of session, but no pain.  Pt will benefit from skilled PT to address gait and balance impairments and LE weakness.   OBJECTIVE IMPAIRMENTS: Abnormal gait, decreased balance, and decreased strength.   ACTIVITY LIMITATIONS: carrying,  squatting, stairs, transfers, and locomotion level  PARTICIPATION LIMITATIONS: driving, community activity, and yard work  PERSONAL FACTORS: Age, Time since onset of injury/illness/exacerbation, and 1-2 comorbidities: h/o chronic infarcts  are also affecting patient's functional outcome.   REHAB POTENTIAL: Good  CLINICAL DECISION MAKING: Evolving/moderate complexity  EVALUATION COMPLEXITY: Moderate  PLAN:  PT FREQUENCY: 2x/week  PT DURATION: 4 weeks  PLANNED INTERVENTIONS: Therapeutic exercises, Therapeutic activity, Neuromuscular re-education, Balance training, Gait training, Patient/Family education, Self Care, Stair training, and DME instructions  PLAN FOR NEXT SESSION:   Continue to facilitate improved step length and stepping strategy to correct LOB. Improve gait speed and dynamic balance training, continue plan   Grier Rocher PT, DPT  Physical Therapist - [facility]  [facility]  10:36 AM 03/02/23

## 2023-03-06 ENCOUNTER — Ambulatory Visit: Payer: Medicare Other | Admitting: Physical Therapy

## 2023-03-06 DIAGNOSIS — M6281 Muscle weakness (generalized): Secondary | ICD-10-CM

## 2023-03-06 DIAGNOSIS — R262 Difficulty in walking, not elsewhere classified: Secondary | ICD-10-CM

## 2023-03-06 DIAGNOSIS — R278 Other lack of coordination: Secondary | ICD-10-CM

## 2023-03-06 DIAGNOSIS — R2681 Unsteadiness on feet: Secondary | ICD-10-CM

## 2023-03-06 DIAGNOSIS — R2689 Other abnormalities of gait and mobility: Secondary | ICD-10-CM | POA: Diagnosis not present

## 2023-03-06 NOTE — Therapy (Signed)
OUTPATIENT PHYSICAL THERAPY TREATMENT   Patient Name: James Herrera. MRN: [id number] DOB:[birth year], 89 y.o., male 82 Date: 03/06/2023   PCP: Barbette Reichmann, MD REFERRING PROVIDER: Morene Crocker, MD  END OF SESSION:  PT End of Session - 03/06/23 0927     Visit Number 8    Number of Visits 9    Date for PT Re-Evaluation 03/09/23    Authorization Type Medicare & Tricare    Authorization Time Period 02/06/23 - 04/09/23    Progress Note Due on Visit 10    PT Start Time 0932    Equipment Utilized During Treatment Gait belt    Activity Tolerance Patient tolerated treatment well    Behavior During Therapy WFL for tasks assessed/performed               Past Medical History:  Diagnosis Date   BPH (benign prostatic hyperplasia)    History of kidney stones    Hypertension    Past Surgical History:  Procedure Laterality Date   HERNIA REPAIR Right    HERNIA REPAIR Left    HERNIA REPAIR     umbilical   kidney stone removal      x2   PROSTATE BIOPSY N/A 04/22/2020   Procedure: PROSTATE BIOPSY [name];  Surgeon: Orson Ape, MD;  Location: [facility] ORS;  Service: Urology;  Laterality: N/A;   TONSILLECTOMY     There are no problems to display for this patient.   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 Noted to have intentional tremor in the LUE with reaching tasks.   PERTINENT  HISTORY: h/o CVA (chronic lacunar infarcts per MRI Nov. 2022); h/o Lt arm and hand weakness which progressively worsened after receiving Covid vaccine in 2021, HTN  PAIN:  Are you having pain? No  PRECAUTIONS: Fall  WEIGHT BEARING RESTRICTIONS: No  FALLS: Has patient fallen in last 6 months? Yes, removing items from the dryer  PATIENT GOALS: Improve balance   OBJECTIVE:   TODAY'S TREATMENT:  DATE:  03/06/23 Gait belt donned and CGA provided throughout unless specified otherwise to ensure pt safety   Nustep level 1-4 x 5 min cues for full ROM in the LUE with noted intentional tremor with fatigue.   NMR:  Stepping over cane on floor x 10 with UE support and x 10 without Stepping over 2 canes and 2 half bolsters x 6 alternating lead LE.  Lateral step over cane and back x 10 bil  Sit<>stand with shoulder abduction and retraction x 10 Seated floor touch then shoulder abduction and retraction x 10  Lateral step over cane with trunk rotation x 10 bil  Forward step with UE opening. X 10 bil  Weave through 8 cones forward.  Weave through four cones forward/revrse x 2 bouts   Throughout session min cues from PT for improved step length, improved heel contact, and improved ROM with UE movement as tolerated. Noted to be limited in trunkal rotation on the L ide.    PATIENT EDUCATION: Education details:Pt educated throughout session about proper posture and technique with exercises. Improved exercise technique, movement at target joints, use of target muscles after min to mod verbal, visual, tactile cues.   HOME EXERCISE PROGRAM: continue HEP as previously given Access Code: [email or web address] URL: [email or web address] Date: 02/09/2023 Prepared by: Grier Rocher  Exercises - Standing Tandem Balance with Counter Support  - 1 x daily - 5 x weekly - 2 sets - 3  reps - 15 hold - Narrow Stance with Counter Support  - 1 x daily - 5 x weekly - 2 sets - 3 reps - 15 hold - Seated Hip Abduction with Resistance  - 1 x daily - 5 x weekly - 3 sets - 10 reps - 3 hold - Seated March with Resistance  - 1 x daily - 5 x weekly - 3 sets - 10 reps - 3 hold - Standing Hip Extension with Counter Support  - 1 x daily - 5 x weekly - 2 sets - 10 reps - Sit to Stand with Arms Crossed  - 1 x daily - 5 x weekly - 3 sets - 5 reps  GOALS: Goals reviewed with patient? Yes  SHORT TERM GOALS: (never established)   LONG TERM GOALS: Target date: 03-09-23  Improve Berg balance test score to >/= 43/56 to reduce fall risk. Baseline:  38/56 Goal status: INITIAL  2.  Improve TUG score to </= 12 secs with no device to increase safety and reduce fall risk with mobility. Baseline: 14.00 secs with no device Goal status: INITIAL  3.  Amb. 300' on indoor/outdoor surfaces with use of SPC with supervision for increased safety with ambulation on uneven surfaces/terrains. Baseline: pt declines use of assistive device at this time; 02/20/23: 687ft without device independently  Goal status: MET  4.  Perform 5x sit to stand transfers without use of UE support in </= 19 secs from standard chair to demo improved bil. LE strength.  Baseline: 24.9 secs with LUE support from standard chair, reps 2 and 3 performed without UE support Goal status: INITIAL  5.  Increase gait velocity to >/= 3.0 ft/sec without device for increased gait efficiency.  Baseline: 2.62 ft/sec (12.5 secs); 02/20/23: 3.0 ft/sec Goal status: MET  6.  Independent in HEP for balance and strengthening exercises. Baseline: 02/20/23: performing HEP assigned on visit 2 Goal status: MET  ASSESSMENT:  CLINICAL IMPRESSION: PT treatment focused on dynamic balance training and reciprocal movement patterns. Pt demonstrates improved step length and proper  weight shift as well as improved UE movement with increased repetitions  throughout session. Pt will benefit from skilled PT to address gait and balance impairments and LE weakness.   OBJECTIVE IMPAIRMENTS: Abnormal gait, decreased balance, and decreased strength.   ACTIVITY LIMITATIONS: carrying, squatting, stairs, transfers, and locomotion level  PARTICIPATION LIMITATIONS: driving, community activity, and yard work  PERSONAL FACTORS: Age, Time since onset of injury/illness/exacerbation, and 1-2 comorbidities: h/o chronic infarcts  are also affecting patient's functional outcome.   REHAB POTENTIAL: Good  CLINICAL DECISION MAKING: Evolving/moderate complexity  EVALUATION COMPLEXITY: Moderate  PLAN:  PT FREQUENCY: 2x/week  PT DURATION: 4 weeks  PLANNED INTERVENTIONS: Therapeutic exercises, Therapeutic activity, Neuromuscular re-education, Balance training, Gait training, Patient/Family education, Self Care, Stair training, and DME instructions  PLAN FOR NEXT SESSION:   Weighted gait training.  Blaze pods.  Re-certification.   Grier Rocher PT, DPT  Physical Therapist - [place]  [facility]  9:29 AM 03/06/23

## 2023-03-09 ENCOUNTER — Ambulatory Visit: Payer: Medicare Other | Admitting: Physical Therapy

## 2023-03-09 DIAGNOSIS — M6281 Muscle weakness (generalized): Secondary | ICD-10-CM

## 2023-03-09 DIAGNOSIS — R2681 Unsteadiness on feet: Secondary | ICD-10-CM

## 2023-03-09 DIAGNOSIS — R2689 Other abnormalities of gait and mobility: Secondary | ICD-10-CM

## 2023-03-09 DIAGNOSIS — R262 Difficulty in walking, not elsewhere classified: Secondary | ICD-10-CM

## 2023-03-09 DIAGNOSIS — R278 Other lack of coordination: Secondary | ICD-10-CM

## 2023-03-09 NOTE — Therapy (Signed)
OUTPATIENT PHYSICAL THERAPY TREATMENT/Re-Certification     Patient Name: James Herrera. MRN: [id number] DOB:[birth year], 89 y.o., male 33 Date: 03/09/2023   PCP: Barbette Reichmann, MD REFERRING PROVIDER: Morene Crocker, MD  END OF SESSION:  PT End of Session - 03/09/23 1022     Visit Number 9    Number of Visits 9    Date for PT Re-Evaluation 03/09/23    Authorization Type Medicare & T[employer]    Authorization Time Period 02/06/23 - 04/09/23    Progress Note Due on Visit 10    PT Start Time 1020    PT Stop Time 1100    PT Time Calculation (min) 40 min    Equipment Utilized During Treatment Gait belt    Activity Tolerance Patient tolerated treatment well    Behavior During Therapy WFL for tasks assessed/performed               Past Medical History:  Diagnosis Date   BPH (benign prostatic hyperplasia)    History of kidney stones    Hypertension    Past Surgical History:  Procedure Laterality Date   HERNIA REPAIR Right    HERNIA REPAIR Left    HERNIA REPAIR     umbilical   kidney stone removal      x2   PROSTATE BIOPSY N/A 04/22/2020   Procedure: PROSTATE BIOPSY [name];  Surgeon: Orson Ape, MD;  Location: [facility] ORS;  Service: Urology;  Laterality: N/A;   TONSILLECTOMY     There are no problems to display for this patient.   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 No  pain. Reports that wife feels like he is moving better every week.   PERTINENT HISTORY: h/o CVA (chronic lacunar infarcts per MRI Nov. 2022); h/o Lt arm and hand weakness which progressively worsened after receiving Covid vaccine in 2021, HTN  PAIN:  Are you having pain? No  PRECAUTIONS: Fall  WEIGHT BEARING RESTRICTIONS: No  FALLS: Has patient fallen in last 6 months? Yes, removing items from the dryer  PATIENT GOALS: Improve balance   OBJECTIVE:   TODAY'S TREATMENT:                                                                                                                              DATE:  03/09/23  PT instructed pt in TUG: 11.40 sec (average of 3 trials(12.34, 11.06, 10.8); >13.5 sec indicates increased fall risk)  Patient demonstrates increased fall risk as noted by score of   44/56 on Berg Balance Scale.  (<36= high risk for falls, close to 100%; 37-45 significant >80%; 46-51 moderate >50%; 52-55 lower >25%)  PT instructed pt in TUG: 11.8 sec (average of 3 trials; >13.5 sec indicates increased fall risk)  Pt performed 5 time sit<>stand (5xSTS): 10.88 sec (>15 sec indicates increased fall risk)   Patient demonstrates increased fall risk as noted by score of 15/30 on  Functional Gait Assessment.   <22/30 = predictive of falls, <20/30 = fall in 6 months, <18/30 = predictive of falls in PD MCID: 5 points stroke population, 4 points geriatric population (ANPTA Core Set of Outcome Measures for Adults with Neurologic Conditions, 2018)   PATIENT EDUCATION: Education details: POC with expansion of certification date.  Pt educated throughout session about proper posture and technique with exercises. Improved exercise technique, movement at target joints, use of target muscles after min to mod verbal, visual, tactile cues.   HOME EXERCISE PROGRAM: continue HEP as previously given Access Code: [email or web address] URL: [email or web address] Date: 02/09/2023 Prepared by:  Grier Rocher  Exercises - Standing Tandem Balance with Counter Support  - 1 x daily - 5 x weekly - 2 sets - 3 reps - 15 hold - Narrow Stance with Counter Support  - 1 x daily - 5 x weekly - 2 sets - 3 reps - 15 hold - Seated Hip Abduction with Resistance  - 1 x daily - 5 x weekly - 3 sets - 10 reps - 3 hold - Seated March with Resistance  - 1 x daily - 5 x weekly - 3 sets - 10 reps - 3 hold - Standing Hip Extension with Counter Support  - 1 x daily - 5 x weekly - 2 sets - 10 reps - Sit to Stand with Arms Crossed  - 1 x daily - 5 x weekly - 3 sets - 5 reps  GOALS: Goals reviewed with patient? Yes  SHORT TERM GOALS:  Target date 04/13/2023   6.  Independent in HEP for balance and strengthening exercises. Baseline: 02/20/23: performing HEP assigned on visit 2 8/30 HEP to be modified Goal status: In progress    LONG TERM GOALS: Target date. 05/04/2023   Improve Berg balance test score to at least 49 points to indicate reduce fall risk. Baseline:  38/56 8/30: 44/56 Goal status: MET/ revised   2.  Improve TUG score to </= 11 secs with no device to increase safety and reduce fall risk with mobility. Baseline: 14.00 secs with  no device 8/30 11.8 sec  Goal status: MET/ Revised    3.  Amb. 300' on indoor/outdoor surfaces with use of SPC with supervision for increased safety with ambulation on uneven surfaces/terrains. Baseline: pt declines use of assistive device at this time; 02/20/23: 631ft without device independently  Goal status: MET  4.  Perform 5x sit to stand transfers without use of UE support in </= 10 secs from standard chair to demo improved bil. LE strength.  Baseline: 24.9 secs with LUE support from standard chair, reps 2 and 3 performed without UE support 8/30: 10.88 sec  Goal status: MET/ revised    5.  Increase gait velocity to >/= 3.0 ft/sec without device for increased gait efficiency.  Baseline: 2.62 ft/sec (12.5 secs); 02/20/23: 3.0 ft/sec 8/30: 1.74m/s(3.2  ft/sec)  Goal status: MET  6.  Pt will increase FGA to >20/30 to indicate reduced fall risk improved safety and balance, and improved function with community mobility  Baseline: 15 Goals Status: initial   7. Pt will demonstrate increase of 131ft on 6 min walk test to demonstrate improved safety, reduced fall risk and access to community mobility .  Baseline: To completed on next PT visit.  Goal status: initial      ASSESSMENT:  CLINICAL IMPRESSION: PT treatment focused on goal assessment to measure progress to PT intervention for re-certification. Pt demonstrates increased balance as evidenced by improved 5xSTS, improved TUG, Berg, and 10/WT. Pt continues to demonstrate increased fall risk of Berg <45, FGA <20, and TUG >11 sec. Pt will benefit from assessment of 6 min walk test and expansion of HEP. Pt will benefit from skilled PT to address gait and balance impairments and LE weakness. Patient's condition has the potential to improve in response to therapy. Maximum improvement is yet to be obtained. The anticipated improvement is attainable and reasonable in a generally predictable time.   OBJECTIVE IMPAIRMENTS: Abnormal gait, decreased balance, and decreased strength.   ACTIVITY LIMITATIONS: carrying, squatting, stairs, transfers, and locomotion level  PARTICIPATION LIMITATIONS: driving, community activity, and yard work  PERSONAL FACTORS: Age, Time since onset of injury/illness/exacerbation, and 1-2 comorbidities: h/o chronic infarcts  are also affecting patient's functional outcome.   REHAB POTENTIAL: Good  CLINICAL DECISION MAKING: Evolving/moderate complexity  EVALUATION COMPLEXITY: Moderate  PLAN:  PT FREQUENCY: 2x/week  PT DURATION: 4 weeks  PLANNED INTERVENTIONS: Therapeutic exercises, Therapeutic activity, Neuromuscular re-education, Balance training, Gait training, Patient/Family education, Self Care, Stair training, and DME instructions  PLAN FOR NEXT SESSION:    6 min walk test.  Expand balance HEP.    Grier Rocher PT, DPT  Physical Therapist - [place]  [facility]  10:23 AM 03/09/23

## 2023-03-13 ENCOUNTER — Ambulatory Visit: Payer: Medicare Other | Attending: Neurology

## 2023-03-13 DIAGNOSIS — R2681 Unsteadiness on feet: Secondary | ICD-10-CM | POA: Diagnosis present

## 2023-03-13 DIAGNOSIS — R262 Difficulty in walking, not elsewhere classified: Secondary | ICD-10-CM | POA: Insufficient documentation

## 2023-03-13 DIAGNOSIS — R278 Other lack of coordination: Secondary | ICD-10-CM | POA: Insufficient documentation

## 2023-03-13 DIAGNOSIS — R2689 Other abnormalities of gait and mobility: Secondary | ICD-10-CM | POA: Diagnosis present

## 2023-03-13 DIAGNOSIS — M6281 Muscle weakness (generalized): Secondary | ICD-10-CM | POA: Insufficient documentation

## 2023-03-13 NOTE — Therapy (Signed)
OUTPATIENT PHYSICAL THERAPY TREATMENT     Patient Name: James Herrera. MRN: [id number] DOB:March 17, 1934, 89 y.o., male 50 Date: 03/13/2023   PCP: Barbette Reichmann, MD REFERRING PROVIDER: Morene Crocker, MD  END OF SESSION:  PT End of Session - 03/13/23 0852     Visit Number 10    Number of Visits 25    Date for PT Re-Evaluation 05/04/23    Authorization Type Medicare & T[employer]are    Authorization Time Period 02/06/23 - 04/09/23    Progress Note Due on Visit 10    PT Start Time 0845    PT Stop Time 0925    PT Time Calculation (min) 40 min    Equipment Utilized During Treatment Gait belt    Activity Tolerance Patient tolerated treatment well    Behavior During Therapy WFL for tasks assessed/performed               Past Medical History:  Diagnosis Date   BPH (benign prostatic hyperplasia)    History of kidney stones    Hypertension    Past Surgical History:  Procedure Laterality Date   HERNIA REPAIR Right    HERNIA REPAIR Left    HERNIA REPAIR     umbilical   kidney stone removal      x2   PROSTATE BIOPSY N/A 04/22/2020   Procedure: PROSTATE BIOPSY [name];  Surgeon: Orson Ape, MD;  Location: [facility] ORS;  Service: Urology;  Laterality: N/A;   TONSILLECTOMY     There are no problems to display for this patient.   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 HEP being performed most of time. Pt says  his wife has noted his improvements.    PERTINENT HISTORY: h/o CVA (chronic lacunar infarcts per MRI Nov. 2022); h/o Lt arm and hand weakness which progressively worsened after receiving Covid vaccine in 2021, HTN  PAIN:  Are you having pain? No  PRECAUTIONS: Fall  WEIGHT BEARING RESTRICTIONS: No  FALLS: Has patient fallen in last 6 months? Yes, removing items from the dryer  PATIENT GOALS:  Improve balance   OBJECTIVE:   TODAY'S TREATMENT:                                                                                                                              DATE:  03/13/23  -Nustep 6 minutes Levels 2-4, seat 8, arms 9  - : 1178ft, no device, no LOB  -airex pad forward step up/down 1x10 bilat  - 2 airex pad forward step up/down 1x10 bilat  -airex fwd step up, 9" gap step to 2nd airex, then fwd step down + 180 degree turn x16 -airex fwd step up,13" gap step to 2nd airex, then fwd step down + 180 degree turn x12   PATIENT EDUCATION: Education details: POC with expansion of certification date.  Pt educated throughout session about proper posture and technique with exercises. Improved exercise technique, movement at target joints, use of target muscles after min to mod verbal, visual, tactile cues.   HOME EXERCISE PROGRAM: continue HEP as previously given Access Code: [email or web address] URL: [email or web address] Date: 02/09/2023 Prepared by: Grier Rocher  Exercises - Standing Tandem Balance with Counter Support  - 1 x daily - 5 x weekly - 2 sets - 3 reps - 15 hold - Narrow Stance with Counter Support  - 1 x daily - 5 x weekly - 2 sets - 3 reps - 15 hold - Seated Hip Abduction with Resistance  - 1 x daily - 5 x weekly - 3 sets - 10 reps - 3 hold - Seated March with Resistance  - 1 x daily - 5 x weekly - 3 sets - 10 reps - 3 hold - Standing Hip Extension with Counter Support  - 1 x daily - 5 x weekly - 2 sets - 10 reps - Sit to Stand with Arms Crossed  - 1 x  daily - 5 x weekly - 3 sets - 5 reps  GOALS: Goals reviewed with patient? Yes  SHORT TERM GOALS:  Target date 04/13/2023   1.  Independent in HEP for balance and strengthening exercises. Baseline: 02/20/23: performing HEP assigned on visit 2 8/30 HEP to be modified Goal status: In progress    LONG TERM GOALS: Target date. 05/04/2023   Improve Berg balance test score to at least 49 points to indicate reduce fall risk. Baseline:  38/56;  8/30: 44/56;  Goal status: revised   2.  Improve TUG score to </= 11 secs with no device to increase safety and reduce fall risk with mobility. Baseline: 14.00 secs with no device; 8/30 11.8 sec  Goal status: Revised    3.  Amb. 300' on indoor/outdoor surfaces with use of SPC with supervision for increased safety with ambulation on uneven surfaces/terrains. Baseline: pt declines use of assistive device at this time; 02/20/23: 633ft without device independently  Goal status: MET  4.  Perform 5x sit to stand transfers without use of UE support in </= 10 secs from standard chair to demo  improved bil. LE strength.  Baseline: 24.9 secs with LUE support from standard chair, reps 2 and 3 performed without UE support; 8/30: 10.88 sec  Goal status: revised    5.  Increase gait velocity to >/= 3.0 ft/sec without device for increased gait efficiency.  Baseline: 2.62 ft/sec (12.5 secs); 02/20/23: 3.0 ft/sec 8/30: 1.36m/s(3.2 ft/sec)  Goal status: MET  6.  Pt will increase FGA to >20/30 to indicate reduced fall risk improved safety and balance, and improved function with community mobility  Baseline: 15 Goals Status: progressing   7. Pt will demonstrate increase of 127ft on 6 min walk test to demonstrate improved safety, reduced fall risk and access to community mobility .  Baseline: 03/13/23: 1157ft  Goal status: progressing     ASSESSMENT:  CLINICAL IMPRESSION: Continued to work on balance interventions. Executed . Pt remains motivated to work  toward improvements in goals. Patient's condition has the potential to improve in response to therapy. Maximum improvement is yet to be obtained. The anticipated improvement is attainable and reasonable in a generally predictable time.   OBJECTIVE IMPAIRMENTS: Abnormal gait, decreased balance, and decreased strength.   ACTIVITY LIMITATIONS: carrying, squatting, stairs, transfers, and locomotion level  PARTICIPATION LIMITATIONS: driving, community activity, and yard work  PERSONAL FACTORS: Age, Time since onset of injury/illness/exacerbation, and 1-2 comorbidities: h/o chronic infarcts  are also affecting patient's functional outcome.   REHAB POTENTIAL: Good  CLINICAL DECISION MAKING: Evolving/moderate complexity  EVALUATION COMPLEXITY: Moderate  PLAN:  PT FREQUENCY: 2x/week  PT DURATION: 4 weeks  PLANNED INTERVENTIONS: Therapeutic exercises, Therapeutic activity, Neuromuscular re-education, Balance training, Gait training, Patient/Family education, Self Care, Stair training, and DME instructions  PLAN FOR NEXT SESSION:    9:26 AM, 03/13/23 Rosamaria Lints, PT, DPT Physical Therapist - [facility]  Outpatient Physical Therapy- [facility] [phone]

## 2023-03-16 ENCOUNTER — Ambulatory Visit: Payer: Medicare Other | Admitting: Physical Therapy

## 2023-03-16 DIAGNOSIS — R278 Other lack of coordination: Secondary | ICD-10-CM

## 2023-03-16 DIAGNOSIS — M6281 Muscle weakness (generalized): Secondary | ICD-10-CM

## 2023-03-16 DIAGNOSIS — R262 Difficulty in walking, not elsewhere classified: Secondary | ICD-10-CM

## 2023-03-16 DIAGNOSIS — R2681 Unsteadiness on feet: Secondary | ICD-10-CM

## 2023-03-16 DIAGNOSIS — R2689 Other abnormalities of gait and mobility: Secondary | ICD-10-CM

## 2023-03-16 NOTE — Therapy (Signed)
OUTPATIENT PHYSICAL THERAPY TREATMENT     Patient Name: James Herrera. MRN: [id number] DOB:25-Aug-1933, 89 y.o., male 68 Date: 03/16/2023   PCP: Barbette Reichmann, MD REFERRING PROVIDER: Morene Crocker, MD  END OF SESSION:  PT End of Session - 03/16/23 1018     Visit Number 11    Number of Visits 25    Date for PT Re-Evaluation 05/04/23    Authorization Type Medicare & T[employer]are    Authorization Time Period 02/06/23 - 04/09/23    Progress Note Due on Visit 20    PT Start Time 1015    PT Stop Time 1055    PT Time Calculation (min) 40 min    Equipment Utilized During Treatment Gait belt    Activity Tolerance Patient tolerated treatment well    Behavior During Therapy WFL for tasks assessed/performed               Past Medical History:  Diagnosis Date   BPH (benign prostatic hyperplasia)    History of kidney stones    Hypertension    Past Surgical History:  Procedure Laterality Date   HERNIA REPAIR Right    HERNIA REPAIR Left    HERNIA REPAIR     umbilical   kidney stone removal      x2   PROSTATE BIOPSY N/A 04/22/2020   Procedure: PROSTATE BIOPSY [name];  Surgeon: Orson Ape, MD;  Location: [facility] ORS;  Service: Urology;  Laterality: N/A;   TONSILLECTOMY     There are no problems to display for this patient.   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 HEP being  performed most of time. Pt says his wife has noted his improvements.    PERTINENT HISTORY: h/o CVA (chronic lacunar infarcts per MRI Nov. 2022); h/o Lt arm and hand weakness which progressively worsened after receiving Covid vaccine in 2021, HTN  PAIN:  Are you having pain? No  PRECAUTIONS: Fall  WEIGHT BEARING RESTRICTIONS: No  FALLS: Has patient fallen in last 6 months? Yes, removing items from  the dryer  PATIENT GOALS: Improve balance   OBJECTIVE:   TODAY'S TREATMENT:                                                                                                                              DATE:  03/16/23  Patient demonstrates increased fall risk as noted by score of 19/30 on  Functional Gait Assessment.   <22/30 = predictive of falls, <20/30 = fall in 6 months, <18/30 = predictive of falls in PD MCID: 5 points stroke population, 4 points geriatric population (ANPTA Core Set of Outcome Measures for Adults with Neurologic Conditions, 2018) Significant improvement on gait with eyes closed and retroversion compared to prior assessment. LOB only noted on tandem gait.   Single UE farmers cary 15# kettle bell 2 x 79ft bil.  Low row on Matrix with cues for erect posture. Performed in sitting to prevent rounding of shoulders. 2 x 12, 12.5#.   Matrix resisted gait forward 2.5# x 2, 7.5# x2. Reverse 7.5# x 2   Cues for improved step length intermittenty and increased arm swing with gait as able. Limited on the LUE due to nerve damage.    PATIENT EDUCATION: Education details: POC with expansion of certification date.  Pt educated throughout session about proper posture and technique with exercises. Improved exercise technique, movement at target joints, use of target muscles after min to mod verbal, visual, tactile cues.   HOME EXERCISE PROGRAM: continue HEP as previously given Access Code: [email or web address] URL: [email or web address] Date: 02/09/2023 Prepared by: Grier Rocher  Exercises - Standing Tandem Balance with Counter Support  - 1 x daily - 5 x weekly - 2 sets - 3 reps - 15 hold - Narrow Stance with Counter Support  - 1 x daily - 5 x weekly - 2 sets - 3 reps - 15 hold - Seated Hip Abduction with Resistance  - 1 x daily - 5 x weekly - 3 sets - 10 reps - 3 hold - Seated March with Resistance  - 1 x daily - 5 x weekly - 3 sets - 10 reps - 3 hold - Standing Hip Extension with Counter Support  - 1 x daily - 5 x weekly - 2 sets - 10 reps - Sit to Stand with Arms Crossed  - 1 x daily - 5 x weekly - 3 sets - 5 reps  GOALS: Goals reviewed with patient? Yes  SHORT TERM GOALS:  Target date 04/13/2023   1.  Independent in HEP for balance and strengthening exercises. Baseline: 02/20/23: performing HEP assigned on visit 2 8/30 HEP to be modified Goal status: In progress    LONG TERM GOALS: Target date. 05/04/2023   Improve Berg balance test score to at least 49 points to indicate reduce fall risk. Baseline:  38/56;  8/30: 44/56;  Goal status: revised   2.  Improve TUG score to </= 11 secs with no device to increase safety and reduce  fall risk with mobility. Baseline: 14.00 secs with no device; 8/30 11.8 sec  Goal status: Revised    3.  Amb. 300' on indoor/outdoor surfaces with use of SPC with supervision for increased safety with ambulation on uneven surfaces/terrains. Baseline: pt declines use of assistive device at this time; 02/20/23: 655ft without device independently  Goal status: MET  4.  Perform 5x sit to stand transfers without use of UE support in </= 10 secs from standard chair to demo improved bil. LE strength.  Baseline: 24.9 secs with LUE support from standard chair, reps 2 and 3 performed without UE support; 8/30: 10.88 sec  Goal status: revised    5.  Increase gait velocity to >/= 3.0 ft/sec without device for increased gait efficiency.  Baseline: 2.62 ft/sec (12.5 secs); 02/20/23: 3.0 ft/sec 8/30: 1.3m/s(3.2 ft/sec)  Goal status:  MET  6.  Pt will increase FGA to >20/30 to indicate reduced fall risk improved safety and balance, and improved function with community mobility  Baseline: 15 9/6: 19 Goals Status: progressing   7. Pt will demonstrate increase of 160ft on 6 min walk test to demonstrate improved safety, reduced fall risk and access to community mobility .  Baseline: 03/13/23: 1123ft  Goal status: progressing     ASSESSMENT:  CLINICAL IMPRESSION: Continued to work on balance interventions. Pt completed FGA; score of 19 on this day. Improved safety with eyes closed and reverse gait. Resisted gait training and extensor chain muscle activation performed with min cues for posture and cues for increased step length as able. Was noted to consistently maintain improved step length following instruction from PT. Pt will continue to benefit from skilled PT to  address stregnth and balance deficits and improve QOL.    OBJECTIVE IMPAIRMENTS: Abnormal gait, decreased balance, and decreased strength.   ACTIVITY LIMITATIONS: carrying, squatting, stairs, transfers, and locomotion level  PARTICIPATION LIMITATIONS: driving, community activity, and yard work  PERSONAL FACTORS: Age, Time since onset of injury/illness/exacerbation, and 1-2 comorbidities: h/o chronic infarcts  are also affecting patient's functional outcome.   REHAB POTENTIAL: Good  CLINICAL DECISION MAKING: Evolving/moderate complexity  EVALUATION COMPLEXITY: Moderate  PLAN:  PT FREQUENCY: 2x/week  PT DURATION: 4 weeks  PLANNED INTERVENTIONS: Therapeutic exercises, Therapeutic activity, Neuromuscular re-education, Balance training, Gait training, Patient/Family education, Self Care, Stair training, and DME instructions  PLAN FOR NEXT SESSION:   Variable and weighted gait training. Dynamic balance training.    10:21 AM, 03/16/23 Rosamaria Lints, PT, DPT Physical Therapist - [place] [facility]  Outpatient Physical  Therapy- Main Campus [phone]

## 2023-03-20 ENCOUNTER — Ambulatory Visit: Payer: Medicare Other

## 2023-03-20 DIAGNOSIS — M6281 Muscle weakness (generalized): Secondary | ICD-10-CM

## 2023-03-20 DIAGNOSIS — R278 Other lack of coordination: Secondary | ICD-10-CM

## 2023-03-20 DIAGNOSIS — R262 Difficulty in walking, not elsewhere classified: Secondary | ICD-10-CM

## 2023-03-20 DIAGNOSIS — R2681 Unsteadiness on feet: Secondary | ICD-10-CM

## 2023-03-20 DIAGNOSIS — R2689 Other abnormalities of gait and mobility: Secondary | ICD-10-CM

## 2023-03-20 NOTE — Therapy (Signed)
OUTPATIENT PHYSICAL THERAPY TREATMENT     Patient Name: James Herrera. MRN: [id number] DOB:[birth year], 89 y.o., male 34 Date: 03/20/2023   PCP: Barbette Reichmann, MD REFERRING PROVIDER: Morene Crocker, MD  END OF SESSION:  PT End of Session - 03/20/23 0931     Visit Number 12    Number of Visits 25    Date for PT Re-Evaluation 05/04/23    Authorization Type Medicare & Tricare    Authorization Time Period 02/06/23 - 04/09/23    Progress Note Due on Visit 20    PT Start Time 0931    PT Stop Time 1014    PT Time Calculation (min) 43 min    Equipment Utilized During Treatment Gait belt    Activity Tolerance Patient tolerated treatment well    Behavior During Therapy WFL for tasks assessed/performed                Past Medical History:  Diagnosis Date   BPH (benign prostatic hyperplasia)    History of kidney stones    Hypertension    Past Surgical History:  Procedure Laterality Date   HERNIA REPAIR Right    HERNIA REPAIR Left    HERNIA REPAIR     umbilical   kidney stone removal      x2   PROSTATE BIOPSY N/A 04/22/2020   Procedure: PROSTATE BIOPSY [name];  Surgeon: Orson Ape, MD;  Location: [facility] ORS;  Service: Urology;  Laterality: N/A;   TONSILLECTOMY     There are no problems to display for this patient.   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 HEP going well and performing "not often enough". Wife says he is walking better and he believes he is also.   PERTINENT HISTORY: h/o CVA (chronic lacunar infarcts per MRI Nov. 2022); h/o Lt arm and hand weakness which progressively worsened after receiving Covid vaccine in 2021, HTN  PAIN:  Are you having pain? No  PRECAUTIONS: Fall  WEIGHT BEARING RESTRICTIONS: No  FALLS: Has  patient fallen in last 6 months? Yes, removing items from the dryer  PATIENT GOALS: Improve balance   OBJECTIVE:   TODAY'S TREATMENT:                                                                                                                              DATE:  03/20/23    Sit to stand with no UE support 2 x 10     Single UE farmers cary 15# kettle bell 2 x 48ft bil.  Low row on Matrix with cues for erect posture. Performe d in sitting to prevent rounding of shoulders. 2 x 12, 12.5#.   Matrix resisted gait forward 2.5# x 4, Reverse 7.5# x 4; Lateral 2.5# x 3 each direction   Step ups onto airex 2 x 15 leading with each LE, very fatigue at end of exercise required seated rest break prior to exiting session.   Cues for improved step length intermittenty and increased arm swing with gait as able. Limited on the LUE due to nerve damage.    PATIENT EDUCATION: Education details: POC with expansion of certification date.  Pt educated throughout session about proper posture and technique with exercises. Improved exercise technique, movement at target joints, use of target muscles after min to mod verbal, visual, tactile cues.   HOME EXERCISE PROGRAM: continue HEP as previously given Access Code: [email or web address] URL: [email or web address] Date: 02/09/2023 Prepared by: Grier Rocher  Exercises - Standing Tandem Balance with Counter Support  - 1 x daily - 5 x weekly - 2 sets - 3 reps - 15 hold - Narrow Stance with Counter Support  - 1 x daily - 5 x weekly - 2 sets - 3 reps - 15 hold -  Seated Hip Abduction with Resistance  - 1 x daily - 5 x weekly - 3 sets - 10 reps - 3 hold - Seated March with Resistance  - 1 x daily - 5 x weekly - 3 sets - 10 reps - 3 hold - Standing Hip Extension with Counter Support  - 1 x daily - 5 x weekly - 2 sets - 10 reps - Sit to Stand with Arms Crossed  - 1 x daily - 5 x weekly - 3 sets - 5 reps  GOALS: Goals reviewed with patient? Yes  SHORT TERM GOALS:  Target date 04/13/2023   1.  Independent in HEP for balance and strengthening exercises. Baseline: 02/20/23: performing HEP assigned on visit 2 8/30 HEP to be modified Goal status: In progress    LONG TERM GOALS: Target date. 05/04/2023   Improve Berg balance test score to at least 49 points to indicate reduce fall risk. Baseline:  38/56;  8/30: 44/56;  Goal status: revised   2.  Improve TUG score to </= 11 secs with no device to increase safety and reduce fall risk with mobility. Baseline: 14.00 secs with no device; 8/30 11.8 sec  Goal status: Revised  3.  Amb. 300' on indoor/outdoor surfaces with use of SPC with supervision for increased safety with ambulation on uneven surfaces/terrains. Baseline: pt declines use of assistive device at this time; 02/20/23: 650ft without device independently  Goal status: MET  4.  Perform 5x sit to stand transfers without use of UE support in </= 10 secs from standard chair to demo improved bil. LE strength.  Baseline: 24.9 secs with LUE support from standard chair, reps 2 and 3 performed without UE support; 8/30: 10.88 sec  Goal status: revised    5.  Increase gait velocity to >/= 3.0 ft/sec without device for increased gait efficiency.  Baseline: 2.62 ft/sec (12.5 secs); 02/20/23: 3.0 ft/sec 8/30: 1.37m/s(3.2 ft/sec)  Goal status: MET  6.  Pt will increase FGA to >20/30 to indicate reduced fall risk improved safety and balance, and improved function with community mobility  Baseline: 15 9/6: 19 Goals Status: progressing   7. Pt will  demonstrate increase of 186ft on 6 min walk test to demonstrate improved safety, reduced fall risk and access to community mobility .  Baseline: 03/13/23: 1125ft  Goal status: progressing     ASSESSMENT:  CLINICAL IMPRESSION:  Continued to work on balance interventions. Resisted gait training and extensor chain muscle activation performed with min cues for posture and cues for increased step length as able. Requires seated rest breaks after resisted gait training and prior to exiting session. Pt will continue to benefit from skilled PT to  address stregnth and balance deficits and improve QOL.    OBJECTIVE IMPAIRMENTS: Abnormal gait, decreased balance, and decreased strength.   ACTIVITY LIMITATIONS: carrying, squatting, stairs, transfers, and locomotion level  PARTICIPATION LIMITATIONS: driving, community activity, and yard work  PERSONAL FACTORS: Age, Time since onset of injury/illness/exacerbation, and 1-2 comorbidities: h/o chronic infarcts  are also affecting patient's functional outcome.   REHAB POTENTIAL: Good  CLINICAL DECISION MAKING: Evolving/moderate complexity  EVALUATION COMPLEXITY: Moderate  PLAN:  PT FREQUENCY: 2x/week  PT DURATION: 4 weeks  PLANNED INTERVENTIONS: Therapeutic exercises, Therapeutic activity, Neuromuscular re-education, Balance training, Gait training, Patient/Family education, Self Care, Stair training, and DME instructions  PLAN FOR NEXT SESSION:   Variable and weighted gait training. Dynamic balance training.    12:58 PM, 03/20/23 Maylon Peppers, PT, DPT Physical Therapist - [place]  [facility]

## 2023-03-23 ENCOUNTER — Ambulatory Visit: Payer: Medicare Other | Admitting: Physical Therapy

## 2023-03-23 DIAGNOSIS — R2689 Other abnormalities of gait and mobility: Secondary | ICD-10-CM

## 2023-03-23 DIAGNOSIS — R262 Difficulty in walking, not elsewhere classified: Secondary | ICD-10-CM | POA: Diagnosis not present

## 2023-03-23 DIAGNOSIS — R278 Other lack of coordination: Secondary | ICD-10-CM

## 2023-03-23 DIAGNOSIS — M6281 Muscle weakness (generalized): Secondary | ICD-10-CM

## 2023-03-23 DIAGNOSIS — R2681 Unsteadiness on feet: Secondary | ICD-10-CM

## 2023-03-23 NOTE — Therapy (Signed)
OUTPATIENT PHYSICAL THERAPY TREATMENT     Patient Name: James Herrera. MRN: [id number] DOB:07/23/1933, 89 y.o., male 52 Date: 03/23/2023   PCP: Barbette Reichmann, MD REFERRING PROVIDER: Morene Crocker, MD  END OF SESSION:  PT End of Session - 03/23/23 1017     Visit Number 13    Number of Visits 25    Date for PT Re-Evaluation 05/04/23    Authorization Type Medicare & Tricare    Authorization Time Period 02/06/23 - 04/09/23    Progress Note Due on Visit 20    PT Start Time 1015    PT Stop Time 1100    PT Time Calculation (min) 45 min    Equipment Utilized During Treatment Gait belt    Activity Tolerance Patient tolerated treatment well    Behavior During Therapy WFL for tasks assessed/performed                Past Medical History:  Diagnosis Date   BPH (benign prostatic hyperplasia)    History of kidney stones    Hypertension    Past Surgical History:  Procedure Laterality Date   HERNIA REPAIR Right    HERNIA REPAIR Left    HERNIA REPAIR     umbilical   kidney stone removal      x2   PROSTATE BIOPSY N/A 04/22/2020   Procedure: PROSTATE BIOPSY [name];  Surgeon: Orson Ape, MD;  Location: [facility] ORS;  Service: Urology;  Laterality: N/A;   TONSILLECTOMY     There are no problems to display for this patient.   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  Rationale for Evaluation and Treatment: Rehabilitation  SUBJECTIVE:                                                                                                                                                                                             SUBJECTIVE STATEMENT:   Patient reports that after last PT session, he was walking to parking lot and BLE froze due to fatigue/weakness. States that he was unable to take additional steps due to fear of falling from fatigue.  Valet parking attendants saw him and was able to bring transport chair and assist him to car. No other issues since.    PERTINENT HISTORY: h/o CVA (chronic lacunar infarcts per MRI Nov. 2022); h/o Lt arm and hand weakness which progressively worsened after receiving Covid vaccine in 2021, HTN  PAIN:  Are you having pain? No  PRECAUTIONS: Fall  WEIGHT  BEARING RESTRICTIONS: No  FALLS: Has patient fallen in last 6 months? Yes, removing items from the dryer  PATIENT GOALS: Improve balance   OBJECTIVE:   TODAY'S TREATMENT:                                                                                                                              DATE:  03/23/23    Octane neurologic priming x 5 min level 5 with cues for consistent speed throughout.   In parallel bars: Standing on airex pad:  Normal BOS x 1 min  Narrow BOS 2 x 30 sec  Standing 1 foot on pad, 1 foot on 6 inch step 2 x 20 sec bil Reciprocal foot tap on 6 inch cone with UE support x 3 bil and  without UE support 2 x 12 bil.  Sharpen rhomberg stance on airex pad 2 x 30 sec with intermittent UE support progressing to no UE support cues for visual focus on stable object on wall to improve visual feedback Walking over canes(2) and half bolster(2) x 4 with UE support on rails and x 8 without UE. Pt able to demonstrate reciprocal pattern 75% of steps, but CGA for PT improved weight shift and cues for heel contact  CGA for safety throughout session with cues for ankle strategy to correct AP and lateral LOB.   PATIENT EDUCATION: Education details:  Pt educated throughout session about proper posture and technique with exercises. Improved exercise technique, movement at target joints, use of target muscles after min to mod verbal, visual, tactile cues.   HOME EXERCISE PROGRAM: continue HEP as previously given Access Code: [email or web address] URL: [email or web address] Date: 02/09/2023 Prepared by: Grier Rocher  Exercises - Standing Tandem Balance with Counter Support  - 1 x daily - 5 x weekly - 2 sets - 3 reps - 15 hold - Narrow Stance with Counter Support  - 1 x daily - 5 x weekly - 2 sets - 3 reps - 15 hold - Seated Hip Abduction with Resistance  - 1 x daily - 5 x weekly - 3 sets - 10 reps - 3 hold - Seated March with Resistance  - 1 x daily - 5 x weekly - 3 sets - 10 reps - 3 hold - Standing Hip Extension with Counter Support  - 1 x daily - 5 x weekly - 2 sets - 10 reps - Sit to Stand with Arms Crossed  - 1 x daily - 5 x weekly - 3 sets - 5 reps  GOALS: Goals reviewed with patient? Yes  SHORT TERM GOALS:  Target date 04/13/2023   1.  Independent in HEP for balance and strengthening exercises. Baseline: 02/20/23: performing HEP assigned on visit 2 8/30 HEP to be modified Goal status: In progress    LONG TERM GOALS: Target date. 05/04/2023   Improve Berg balance test score to at least 49 points to indicate reduce fall risk. Baseline:  38/56;  8/30: 44/56;  Goal status: revised   2.  Improve TUG score to </= 11 secs with no device to increase safety and reduce fall risk with mobility. Baseline: 14.00 secs with no device; 8/30 11.8 sec  Goal status: Revised    3.  Amb. 300' on indoor/outdoor surfaces with use of SPC with supervision for increased safety with ambulation on uneven surfaces/terrains. Baseline: pt declines use of assistive device at this time; 02/20/23: 628ft without device independently  Goal status: MET  4.  Perform 5x sit to stand transfers without use of UE support in </= 10 secs from standard chair to demo improved bil. LE strength.  Baseline: 24.9 secs with LUE support from standard chair, reps 2 and 3 performed without UE support; 8/30: 10.88 sec  Goal status: revised    5.  Increase gait velocity to >/= 3.0 ft/sec without device for increased gait efficiency.  Baseline: 2.62 ft/sec (12.5 secs); 02/20/23: 3.0 ft/sec 8/30: 1.42m/s(3.2 ft/sec)  Goal status:  MET  6.  Pt will increase FGA to >20/30 to indicate reduced fall risk improved safety and balance, and improved function with community mobility  Baseline: 15 9/6: 19 Goals Status: progressing   7. Pt will demonstrate increase of 141ft on 6 min walk test to demonstrate improved safety, reduced fall risk and access to community mobility .  Baseline: 03/13/23: 1120ft  Goal status: progressing     ASSESSMENT:  CLINICAL IMPRESSION:  Continued to work on balance interventions. With cues for improved ankle strategy to correct lateral and AP LOB. Cues for proper use of visual feedback for improved stability as well as heel contact with step through gait pattern over obstacles. Pt will continue to benefit from skilled PT to  address stregnth and balance deficits and improve QOL.    OBJECTIVE IMPAIRMENTS: Abnormal gait, decreased balance, and decreased strength.   ACTIVITY LIMITATIONS: carrying, squatting, stairs, transfers, and locomotion level  PARTICIPATION LIMITATIONS: driving, community activity, and yard work  PERSONAL FACTORS: Age, Time since onset of injury/illness/exacerbation, and 1-2 comorbidities: h/o chronic infarcts  are also affecting patient's functional outcome.   REHAB POTENTIAL: Good  CLINICAL DECISION MAKING: Evolving/moderate complexity  EVALUATION COMPLEXITY: Moderate  PLAN:  PT FREQUENCY: 2x/week  PT DURATION: 4 weeks  PLANNED INTERVENTIONS: Therapeutic exercises, Therapeutic activity, Neuromuscular re-education, Balance training, Gait training, Patient/Family education, Self Care, Stair training, and DME instructions  PLAN FOR NEXT SESSION:   Variable and weighted gait training. Dynamic balance training.    Grier Rocher PT, DPT  Physical Therapist - [facility]  11:19 AM 03/23/23

## 2023-03-27 ENCOUNTER — Ambulatory Visit: Payer: Medicare Other

## 2023-03-27 DIAGNOSIS — M6281 Muscle weakness (generalized): Secondary | ICD-10-CM

## 2023-03-27 DIAGNOSIS — R2689 Other abnormalities of gait and mobility: Secondary | ICD-10-CM

## 2023-03-27 DIAGNOSIS — R2681 Unsteadiness on feet: Secondary | ICD-10-CM

## 2023-03-27 DIAGNOSIS — R278 Other lack of coordination: Secondary | ICD-10-CM

## 2023-03-27 DIAGNOSIS — R262 Difficulty in walking, not elsewhere classified: Secondary | ICD-10-CM | POA: Diagnosis not present

## 2023-03-27 NOTE — Therapy (Signed)
OUTPATIENT PHYSICAL THERAPY TREATMENT     Patient Name: James Herrera. MRN: [id number] DOB:May 01, 1934, 89 y.o., male Today's Date: 03/27/2023   PCP: Barbette Reichmann, MD REFERRING PROVIDER: Morene Crocker, MD  END OF SESSION:  PT End of Session - 03/27/23 0936     Visit Number 14    Number of Visits 25    Date for PT Re-Evaluation 05/04/23    Authorization Type Medicare & Tricare    Authorization Time Period 02/06/23 - 04/09/23    Progress Note Due on Visit 20    PT Start Time 0930    PT Stop Time 1010    PT Time Calculation (min) 40 min    Equipment Utilized During Treatment Gait belt    Activity Tolerance Patient tolerated treatment well;No increased pain    Behavior During Therapy WFL for tasks assessed/performed                Past Medical History:  Diagnosis Date   BPH (benign prostatic hyperplasia)    History of kidney stones    Hypertension    Past Surgical History:  Procedure Laterality Date   HERNIA REPAIR Right    HERNIA REPAIR Left    HERNIA REPAIR     umbilical   kidney stone removal      x2   PROSTATE BIOPSY N/A 04/22/2020   Procedure: PROSTATE BIOPSY [name];  Surgeon: Orson Ape, MD;  Location: [facility] OR[facility];  Service: Urology;  Laterality: N/A;   TONSILLECTOMY     There are no problems to display for this patient.   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   PERTINENT HISTORY: h/o CVA (chronic lacunar infarcts per MRI Nov. 2022); h/o Lt arm and hand weakness which progressively worsened after receiving Covid vaccine in 2021, HTN  PAIN:  Are you having pain? No  PRECAUTIONS: Fall  WEIGHT BEARING RESTRICTIONS: No  FALLS: Has patient fallen in last 6 months? Yes, removing items from the dryer  PATIENT GOALS: Improve balance   OBJECTIVE:  TODAY'S TREATMENT:                                                                                                                              DATE:  03/27/23    -Nustep, seate 8 arms 9, level 4 x 3 minutes -10x STS hands free with feet on airex pad  -2lb AW and red applied to knee for overground gait training: 676ft AMB (183ft bakwards; 1x30sec bilat side stepping)   -load transfer 5 items from A to B 1-5 lbs each, 74ft path that includes a 6" step, 2" foam step and a turnaround  *sit break, then bring items back to start  -AMB overgroudn 427ft c redTB at knees and 2lb AW bilat     PATIENT EDUCATION: Education details:  Pt educated throughout session about proper posture and technique with exercises. Improved exercise technique, movement at target joints, use of target muscles after min to mod verbal, visual, tactile cues.   HOME EXERCISE PROGRAM: continue HEP as previously given Access Code: [email or web address] URL: [email or web address] Date: 02/09/2023 Prepared by: Grier Rocher  Exercises - Standing Tandem Balance with Counter Support  - 1 x daily - 5 x weekly - 2 sets - 3 reps - 15 hold - Narrow Stance with Counter Support  - 1 x daily - 5 x weekly - 2 sets - 3 reps - 15 hold - Seated Hip Abduction with Resistance  - 1 x daily - 5 x weekly - 3 sets - 10 reps - 3 hold - Seated March with Resistance  - 1 x daily - 5 x weekly - 3 sets - 10 reps - 3 hold - Standing Hip Extension with Counter Support  - 1 x daily - 5 x weekly - 2 sets - 10 reps - Sit to Stand with Arms  Crossed  - 1 x daily - 5 x weekly - 3 sets - 5 reps  GOALS: Goals reviewed with patient? Yes  SHORT TERM GOALS:  Target date 04/13/2023   1.  Independent in HEP for balance and strengthening exercises. Baseline: 02/20/23: performing HEP assigned on visit 2 8/30 HEP to be modified Goal status: In progress    LONG TERM GOALS: Target date. 05/04/2023   Improve Berg balance test score to at least 49 points to indicate reduce fall risk. Baseline:  38/56;  8/30: 44/56;  Goal status: revised   2.  Improve TUG score to </= 11 secs with no device to increase safety and reduce fall risk with mobility. Baseline: 14.00 secs with no device; 8/30 11.8 sec  Goal status: Revised    3.  Amb. 300' on indoor/outdoor surfaces with use of SPC with supervision for increased safety with ambulation on uneven surfaces/terrains. Baseline: pt declines use of assistive device at this time; 02/20/23: 619ft without device independently  Goal status: MET  4.  Perform 5x sit to stand transfers without use of UE support in </= 10 secs from  standard chair to demo improved bil. LE strength.  Baseline: 24.9 secs with LUE support from standard chair, reps 2 and 3 performed without UE support; 8/30: 10.88 sec  Goal status: revised    5.  Increase gait velocity to >/= 3.0 ft/sec without device for increased gait efficiency.  Baseline: 2.62 ft/sec (12.5 secs); 02/20/23: 3.0 ft/sec 8/30: 1.48m/s(3.2 ft/sec)  Goal status: MET  6.  Pt will increase FGA to >20/30 to indicate reduced fall risk improved safety and balance, and improved function with community mobility  Baseline: 15 9/6: 19 Goals Status: progressing   7. Pt will demonstrate increase of 149ft on 6 min walk test to demonstrate improved safety, reduced fall risk and access to community mobility .  Baseline: 03/13/23: 119ft  Goal status: progressing     ASSESSMENT:  CLINICAL IMPRESSION:  Cotninued to work on functional IADL/household simulation with  measures in place to heighten balance deficits. Many goals met already. Pt would like ot finish out PT this month and DC at end of September. Pt does well in general, requires recovery breaks between. Pt will continue to benefit from skilled PT to  address stregnth and balance deficits and improve QOL.    OBJECTIVE IMPAIRMENTS: Abnormal gait, decreased balance, and decreased strength.   ACTIVITY LIMITATIONS: carrying, squatting, stairs, transfers, and locomotion level  PARTICIPATION LIMITATIONS: driving, community activity, and yard work  PERSONAL FACTORS: Age, Time since onset of injury/illness/exacerbation, and 1-2 comorbidities: h/o chronic infarcts  are also affecting patient's functional outcome.   REHAB POTENTIAL: Good  CLINICAL DECISION MAKING: Evolving/moderate complexity  EVALUATION COMPLEXITY: Moderate  PLAN:  PT FREQUENCY: 2x/week  PT DURATION: 4 weeks  PLANNED INTERVENTIONS: Therapeutic exercises, Therapeutic activity, Neuromuscular re-education, Balance training, Gait training, Patient/Family education, Self Care, Stair training, and DME instructions  PLAN FOR NEXT SESSION:  Variable and weighted gait training. Dynamic balance training.    9:39 AM, 03/27/23 Rosamaria Lints, PT, DPT Physical Therapist - [facility]  [phone] [facility])

## 2023-03-30 ENCOUNTER — Ambulatory Visit: Payer: Medicare Other | Admitting: Physical Therapy

## 2023-04-03 ENCOUNTER — Ambulatory Visit: Payer: Medicare Other

## 2023-04-06 ENCOUNTER — Ambulatory Visit: Payer: Medicare Other | Admitting: Physical Therapy

## 2023-04-06 DIAGNOSIS — R278 Other lack of coordination: Secondary | ICD-10-CM

## 2023-04-06 DIAGNOSIS — M6281 Muscle weakness (generalized): Secondary | ICD-10-CM

## 2023-04-06 DIAGNOSIS — R2689 Other abnormalities of gait and mobility: Secondary | ICD-10-CM

## 2023-04-06 DIAGNOSIS — R262 Difficulty in walking, not elsewhere classified: Secondary | ICD-10-CM

## 2023-04-06 DIAGNOSIS — R2681 Unsteadiness on feet: Secondary | ICD-10-CM

## 2023-04-06 NOTE — Therapy (Signed)
OUTPATIENT PHYSICAL THERAPY TREATMENT     Patient Name: Aristides Gross. MRN: [id number] DOB:1934-06-14, 89 y.o., male Today's Date: 04/06/2023   PCP: Barbette Reichmann, MD REFERRING PROVIDER: Morene Crocker, MD  END OF SESSION:  PT End of Session - 04/06/23 1015     Visit Number 15    Number of Visits 25    Date for PT Re-Evaluation 05/04/23    Authorization Type Medicare & Tricare    Authorization Time Period 02/06/23 - 04/09/23    Progress Note Due on Visit 20    PT Start Time 1015    PT Stop Time 1055    PT Time Calculation (min) 40 min    Equipment Utilized During Treatment Gait belt    Activity Tolerance Patient tolerated treatment well;No increased pain    Behavior During Therapy WFL for tasks assessed/performed                Past Medical History:  Diagnosis Date   BPH (benign prostatic hyperplasia)    History of kidney stones    Hypertension    Past Surgical History:  Procedure Laterality Date   HERNIA REPAIR Right    HERNIA REPAIR Left    HERNIA REPAIR     umbilical   kidney stone removal      x2   PROSTATE BIOPSY N/A 04/22/2020   Procedure: PROSTATE BIOPSY [name];  Surgeon: Orson Ape, MD;  Location: [facility] ORS;  Service: Urology;  Laterality: N/A;   TONSILLECTOMY     There are no problems to display for this patient.   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    THERAPY DIAG:  Difficulty in walking, not elsewhere classified  Other lack of coordination  Other abnormalities of gait and mobility  Muscle weakness (generalized)  Unsteadiness on feet  Rationale for Evaluation and Treatment: Rehabilitation  SUBJECTIVE:                                                                                                                                                                                             SUBJECTIVE STATEMENT:   Reports that he had a good  vacation last week with rental house [place], so he was required to ascend/descend 20-25 steps each time he left the house. Missed last scheduled appointment due to fatigue from vacation.   PERTINENT HISTORY: h/o CVA (chronic lacunar infarcts per MRI Nov. 2022); h/o Lt arm and hand weakness which progressively worsened after receiving Covid vaccine in 2021, HTN  PAIN:  Are you having pain? No  PRECAUTIONS: Fall  WEIGHT BEARING RESTRICTIONS: No  FALLS: Has patient fallen in last 6 months? Yes, removing items from the dryer  PATIENT GOALS: Improve balance   OBJECTIVE:   TODAY'S TREATMENT:                                                                                                                              DATE:  04/06/23    -Nustep, seat 8 arms 9, level 2-4 x 6 minutes  Forward/reverse gait 68ft x 5 Side stepping R and L x x 5 bil with cues for improved step length and to prevent trunk rotation Standing on wedge static x 30 sec. Color sorting posterior facing wedge x 2 min .  Standing on wedge anterior static x 30 sec.  Color sorting on white board x 2 min with wedge anterior.  Reverse lunge with BUE on rail x 8 bil  Step up x 10 on 6inch step and BUE support  Lateral step up x 10  Step over cane forward/reverse x 10 bil with min assist due to difficulty with posterior stepping on the RLE.   Throughout session, PT provided CGA/min assist for safety to prevent posterior LOB and cues for increased step length throughout session with posterior movement.   PATIENT EDUCATION: Education details:  Pt educated throughout session about proper posture and technique with exercises. Improved exercise technique, movement at target joints, use of target muscles after min to mod verbal, visual, tactile cues.   HOME EXERCISE PROGRAM: continue HEP as previously given Access Code: [email or web address] URL: [email or web address] Date: 02/09/2023 Prepared by: Grier Rocher  Exercises - Standing Tandem Balance with Counter Support  - 1 x daily - 5 x weekly - 2 sets - 3 reps - 15 hold - Narrow Stance with Counter Support  - 1 x daily - 5 x weekly - 2 sets - 3 reps - 15 hold - Seated Hip Abduction with Resistance  - 1 x daily - 5 x weekly - 3 sets - 10 reps - 3 hold - Seated March with Resistance  - 1 x daily - 5 x weekly - 3 sets - 10 reps - 3 hold - Standing Hip Extension with Counter Support  - 1 x daily - 5 x weekly - 2 sets - 10 reps - Sit to Stand with Arms Crossed  - 1 x daily - 5 x weekly - 3 sets - 5 reps  GOALS: Goals reviewed with patient? Yes  SHORT TERM GOALS:  Target date 04/13/2023   1.  Independent in HEP for balance and strengthening exercises. Baseline: 02/20/23: performing HEP assigned on visit 2 8/30 HEP to be modified Goal status: In progress    LONG TERM GOALS: Target date. 05/04/2023   Improve Berg balance test score to at least 49 points to indicate reduce fall risk. Baseline:  38/56;  8/30: 44/56;  Goal status: revised   2.  Improve TUG score to </=  11 secs with no device to increase safety and reduce fall risk with mobility. Baseline: 14.00 secs with no device; 8/30 11.8 sec  Goal status: Revised    3.  Amb. 300' on indoor/outdoor surfaces with use of SPC with supervision for increased safety with ambulation on uneven surfaces/terrains. Baseline: pt declines use of assistive device at this time; 02/20/23: 645ft without device independently  Goal status: MET  4.  Perform 5x sit to stand transfers without use of UE support in </= 10 secs from standard chair to demo improved bil. LE strength.  Baseline: 24.9 secs with LUE support from standard chair, reps 2 and 3 performed without UE support; 8/30: 10.88 sec  Goal status: revised    5.  Increase gait velocity to >/= 3.0 ft/sec without device for increased gait efficiency.  Baseline: 2.62 ft/sec (12.5 secs); 02/20/23: 3.0 ft/sec 8/30: 1.63m/s(3.2 ft/sec)  Goal status:  MET  6.  Pt will increase FGA to >20/30 to indicate reduced fall risk improved safety and balance, and improved function with community mobility  Baseline: 15 9/6: 19 Goals Status: progressing   7. Pt will demonstrate increase of 161ft on 6 min walk test to demonstrate improved safety, reduced fall risk and access to community mobility .  Baseline: 03/13/23: 113ft  Goal status: progressing     ASSESSMENT:  CLINICAL IMPRESSION:  Cotninued to work improved posterior chain muscle activation to allow improved safety with retroversion and side stepping. Noted to have mild improvement in posterior step length with increased repetition, but intermittent assist required with fatigue in posterior movements. Pt would like ot finish out PT this month and DC at end of September. Pt does well in general, requires recovery breaks between. Pt will continue to benefit from skilled PT to  address stregnth and balance deficits and improve QOL.    OBJECTIVE IMPAIRMENTS: Abnormal gait, decreased balance, and decreased strength.   ACTIVITY LIMITATIONS: carrying, squatting, stairs, transfers, and locomotion level  PARTICIPATION LIMITATIONS: driving, community activity, and yard work  PERSONAL FACTORS: Age, Time since onset of injury/illness/exacerbation, and 1-2 comorbidities: h/o chronic infarcts  are also affecting patient's functional outcome.   REHAB POTENTIAL: Good  CLINICAL DECISION MAKING: Evolving/moderate complexity  EVALUATION COMPLEXITY: Moderate  PLAN:  PT FREQUENCY: 2x/week  PT DURATION: 4 weeks  PLANNED INTERVENTIONS: Therapeutic exercises, Therapeutic activity, Neuromuscular re-education, Balance training, Gait training, Patient/Family education, Self Care, Stair training, and DME instructions  PLAN FOR NEXT SESSION:   Variable and weighted gait training. Dynamic balance training.   Community fitness education   Grier Rocher PT, DPT  Physical Therapist - [facility]  [facility]  11:37 AM 04/06/23

## 2023-04-10 ENCOUNTER — Ambulatory Visit: Payer: Medicare Other

## 2023-04-13 ENCOUNTER — Ambulatory Visit: Payer: Medicare Other | Admitting: Physical Therapy

## 2023-04-17 ENCOUNTER — Ambulatory Visit: Payer: Medicare Other

## 2023-04-20 ENCOUNTER — Ambulatory Visit: Payer: Medicare Other | Admitting: Physical Therapy

## 2023-04-24 ENCOUNTER — Ambulatory Visit: Payer: Medicare Other

## 2023-04-27 ENCOUNTER — Ambulatory Visit: Payer: Medicare Other | Admitting: Physical Therapy

## 2023-05-01 ENCOUNTER — Ambulatory Visit: Payer: Medicare Other

## 2023-06-21 ENCOUNTER — Ambulatory Visit: Payer: Self-pay | Admitting: Urology

## 2023-07-16 ENCOUNTER — Encounter: Payer: Self-pay | Admitting: Urology

## 2023-07-16 ENCOUNTER — Ambulatory Visit: Payer: Medicare Other | Admitting: Urology

## 2023-07-16 VITALS — BP 166/95 | HR 61 | Ht 65.0 in | Wt 135.0 lb

## 2023-07-16 DIAGNOSIS — R350 Frequency of micturition: Secondary | ICD-10-CM

## 2023-07-16 DIAGNOSIS — Z8546 Personal history of malignant neoplasm of prostate: Secondary | ICD-10-CM | POA: Diagnosis not present

## 2023-07-16 DIAGNOSIS — N401 Enlarged prostate with lower urinary tract symptoms: Secondary | ICD-10-CM | POA: Diagnosis not present

## 2023-07-16 LAB — URINALYSIS, COMPLETE
Bilirubin, UA: NEGATIVE
Glucose, UA: NEGATIVE
Ketones, UA: NEGATIVE
Leukocytes,UA: NEGATIVE
Nitrite, UA: NEGATIVE
Protein,UA: NEGATIVE
RBC, UA: NEGATIVE
Specific Gravity, UA: 1.025 (ref 1.005–1.030)
Urobilinogen, Ur: 0.2 mg/dL (ref 0.2–1.0)
pH, UA: 5.5 (ref 5.0–7.5)

## 2023-07-16 LAB — MICROSCOPIC EXAMINATION

## 2023-07-16 LAB — BLADDER SCAN AMB NON-IMAGING: Scan Result: 0

## 2023-07-16 NOTE — Progress Notes (Signed)
 I,Amy L Pierron,acting as a scribe for Glendia JAYSON Barba, MD.,have documented all relevant documentation on the behalf of Glendia JAYSON Barba, MD,as directed by  Glendia JAYSON Barba, MD while in the presence of Glendia JAYSON Barba, MD.  07/16/2023 7:25 PM   James Herrera. 12-25-1933 [id number]  Referring provider: Sadie Manna, MD [address] [facility] [place],  [place] [address]  Chief Complaint  Patient presents with   Benign Prostatic Hypertrophy    HPI: James Herrera. is a 89 y.o. male referred for evaluation of BPH with lower urinary tract symptoms, however he states he has a history of prostate cancer (treated by Dr. Kassie) and thought he may need regular urologic follow-ups.  His records had been received from Dr. Mikael office, scanned into the chart, and were reviewed. He was diagnosed with T1c, adenocarcinoma of the prostate and treated with brachytherapy in 2021. His PSA pre-treatment was 6.9. At his last visit with Dr. Kassie on 05/03/22, his PSA was 0.2. He has no bothersome LUTS and generally sleeps through the night.  Denies dysuria, gross hematuria.  No flank or abdominal pain.  PSA drawn by PCP, 05/08/23, was 0.08. Gleason score was 4+3   PMH: Past Medical History:  Diagnosis Date   BPH (benign prostatic hyperplasia)    History of kidney stones    Hypertension     Surgical History: Past Surgical History:  Procedure Laterality Date   HERNIA REPAIR Right    HERNIA REPAIR Left    HERNIA REPAIR     umbilical   kidney stone removal      x2   PROSTATE BIOPSY N/A 04/22/2020   Procedure: PROSTATE BIOPSY GRAYCE;  Surgeon: Kassie Ozell SAUNDERS, MD;  Location: [facility] ORS;  Service: Urology;  Laterality: N/A;   TONSILLECTOMY      Home Medications:  Allergies as of 07/16/2023   No Known Allergies      Medication List        Accurate as of July 16, 2023  7:25 PM. If you have any questions, ask your nurse or doctor.           STOP taking these medications    CALCIUM 1200+D3 PO Stopped by: Glendia JAYSON Barba   levofloxacin  500 MG tablet Commonly known as: Levaquin  Stopped by: Glendia JAYSON Barba   vitamin C 1000 MG tablet Stopped by: Glendia JAYSON Barba       TAKE these medications    cyanocobalamin 1000 MCG tablet Take 1,000 mcg by mouth daily.   finasteride 5 MG tablet Commonly known as: PROSCAR Take 5 mg by mouth daily.   gabapentin 400 MG capsule Commonly known as: NEURONTIN   latanoprost 0.005 % ophthalmic solution Commonly known as: XALATAN Place 1 drop into both eyes at bedtime.   metoprolol tartrate 50 MG tablet Commonly known as: LOPRESSOR Take 50 mg by mouth in the morning and at bedtime.   Multi-Vitamin tablet Take 1 tablet by mouth daily.   olmesartan-hydrochlorothiazide 40-25 MG tablet Commonly known as: BENICAR HCT Take 1 tablet by mouth daily.   Omega 3 1200 MG Caps Take 1,200 mg by mouth 4 (four) times a week.   Potassium 99 MG Tabs Take 99 mg by mouth daily.        Allergies: No Known Allergies  Family History: History reviewed. No pertinent family history.  Social History:  reports that he has quit smoking. He has never used smokeless tobacco. He reports that he does  not drink alcohol and does not use drugs.   Physical Exam: BP (!) 166/95   Pulse 61   Ht 5' 5 (1.651 m)   Wt 135 lb (61.2 kg)   BMI 22.47 kg/m   Constitutional:  Alert and oriented, No acute distress. HEENT: [place] AT Respiratory: Normal respiratory effort, no increased work of breathing. Psychiatric: Normal mood and affect.  Assessment & Plan:    1. Prostate cancer History of unfavorable intermediate risk prostate cancer treated with brachytherapy 2021. PSA remains low with most recent PSA 0.08. Continue annual follow-up.  I have reviewed the above documentation for accuracy and completeness, and I agree with the above.   Glendia JAYSON Barba, MD  [facility] [address], [address] [place], [place] [address] [phone]

## 2023-07-23 ENCOUNTER — Ambulatory Visit: Payer: Medicare Other | Admitting: Urology

## 2023-12-20 ENCOUNTER — Encounter: Payer: Self-pay | Admitting: Ophthalmology

## 2023-12-21 ENCOUNTER — Encounter: Payer: Self-pay | Admitting: Ophthalmology

## 2023-12-28 NOTE — Discharge Instructions (Signed)

## 2023-12-31 ENCOUNTER — Ambulatory Visit
Admission: RE | Admit: 2023-12-31 | Discharge: 2023-12-31 | Disposition: A | Discharge status: Home / Self Care | Attending: Ophthalmology | Admitting: Ophthalmology

## 2023-12-31 ENCOUNTER — Other Ambulatory Visit: Payer: Self-pay

## 2023-12-31 ENCOUNTER — Ambulatory Visit: Payer: Self-pay | Admitting: Anesthesiology

## 2023-12-31 ENCOUNTER — Encounter
Admission: RE | Disposition: A | Discharge status: Home / Self Care | Payer: Self-pay | Source: Home / Self Care | Attending: Ophthalmology

## 2023-12-31 DIAGNOSIS — E1136 Type 2 diabetes mellitus with diabetic cataract: Secondary | ICD-10-CM | POA: Diagnosis not present

## 2023-12-31 DIAGNOSIS — I10 Essential (primary) hypertension: Secondary | ICD-10-CM | POA: Insufficient documentation

## 2023-12-31 DIAGNOSIS — H2511 Age-related nuclear cataract, right eye: Secondary | ICD-10-CM | POA: Insufficient documentation

## 2023-12-31 DIAGNOSIS — Z79899 Other long term (current) drug therapy: Secondary | ICD-10-CM | POA: Insufficient documentation

## 2023-12-31 DIAGNOSIS — Z87891 Personal history of nicotine dependence: Secondary | ICD-10-CM | POA: Diagnosis not present

## 2023-12-31 HISTORY — DX: Personal history of transient ischemic attack (TIA), and cerebral infarction without residual deficits: Z86.73

## 2023-12-31 HISTORY — PX: CATARACT EXTRACTION W/PHACO: SHX586

## 2023-12-31 HISTORY — DX: Other symptoms and signs involving the nervous system: R29.818

## 2023-12-31 HISTORY — DX: Polyneuropathy, unspecified: G62.9

## 2023-12-31 HISTORY — DX: Other lack of coordination: R27.8

## 2023-12-31 HISTORY — DX: Other symptoms and signs involving the musculoskeletal system: R29.898

## 2023-12-31 SURGERY — PHACOEMULSIFICATION, CATARACT, WITH IOL INSERTION
Anesthesia: Monitor Anesthesia Care | Site: Eye | Laterality: Right

## 2023-12-31 MED ORDER — TETRACAINE HCL 0.5 % OP SOLN
1.0000 [drp] | OPHTHALMIC | Status: DC | PRN
Start: 2023-12-31 — End: 2023-12-31
  Administered 2023-12-31 (×3): 1 [drp] via OPHTHALMIC

## 2023-12-31 MED ORDER — MIDAZOLAM HCL 2 MG/2ML IJ SOLN
INTRAMUSCULAR | Status: DC | PRN
  Administered 2023-12-31: .5 mg via INTRAVENOUS

## 2023-12-31 MED ORDER — ARMC OPHTHALMIC DILATING DROPS
OPHTHALMIC | Status: AC
Start: 2023-12-31 — End: 2023-12-31
  Filled 2023-12-31: qty 0.5

## 2023-12-31 MED ORDER — LIDOCAINE HCL (PF) 2 % IJ SOLN
INTRAOCULAR | Status: DC | PRN
  Administered 2023-12-31: 4 mL via INTRAOCULAR

## 2023-12-31 MED ORDER — GLYCOPYRROLATE 0.2 MG/ML IJ SOLN
INTRAMUSCULAR | Status: DC | PRN
Start: 2023-12-31 — End: 2023-12-31
  Administered 2023-12-31: .2 mg via INTRAVENOUS

## 2023-12-31 MED ORDER — FENTANYL CITRATE (PF) 100 MCG/2ML IJ SOLN
INTRAMUSCULAR | Status: DC | PRN
  Administered 2023-12-31: 25 ug via INTRAVENOUS

## 2023-12-31 MED ORDER — DROPERIDOL 2.5 MG/ML IJ SOLN: 0.6250 mg | Freq: Once | INTRAMUSCULAR | Status: DC | PRN

## 2023-12-31 MED ORDER — SODIUM CHLORIDE 0.9% FLUSH
INTRAVENOUS | Status: DC | PRN
  Administered 2023-12-31 (×2): 10 mL via INTRAVENOUS

## 2023-12-31 MED ORDER — FENTANYL CITRATE PF 50 MCG/ML IJ SOSY: 25.0000 ug | PREFILLED_SYRINGE | INTRAMUSCULAR | Status: DC | PRN

## 2023-12-31 MED ORDER — SIGHTPATH DOSE#1 NA HYALUR & NA CHOND-NA HYALUR IO KIT
PACK | INTRAOCULAR | Status: DC | PRN
  Administered 2023-12-31: 1 via OPHTHALMIC

## 2023-12-31 MED ORDER — ARMC OPHTHALMIC DILATING DROPS
1.0000 | OPHTHALMIC | Status: DC | PRN
  Administered 2023-12-31 (×3): 1 via OPHTHALMIC

## 2023-12-31 MED ORDER — OXYCODONE HCL 5 MG/5ML PO SOLN: 5.0000 mg | Freq: Once | ORAL | Status: DC | PRN

## 2023-12-31 MED ORDER — OXYCODONE HCL 5 MG PO TABS: 5.0000 mg | ORAL_TABLET | Freq: Once | ORAL | Status: DC | PRN

## 2023-12-31 MED ORDER — SIGHTPATH DOSE#1 BSS IO SOLN
INTRAOCULAR | Status: DC | PRN
Start: 2023-12-31 — End: 2023-12-31
  Administered 2023-12-31: 15 mL via INTRAOCULAR

## 2023-12-31 MED ORDER — TETRACAINE HCL 0.5 % OP SOLN
OPHTHALMIC | Status: AC
  Filled 2023-12-31: qty 4

## 2023-12-31 MED ORDER — FENTANYL CITRATE (PF) 100 MCG/2ML IJ SOLN
INTRAMUSCULAR | Status: AC
  Filled 2023-12-31: qty 2

## 2023-12-31 MED ORDER — SIGHTPATH DOSE#1 BSS IO SOLN
INTRAOCULAR | Status: DC | PRN
  Administered 2023-12-31: 89 mL via OPHTHALMIC

## 2023-12-31 MED ORDER — MIDAZOLAM HCL 2 MG/2ML IJ SOLN
INTRAMUSCULAR | Status: AC
  Filled 2023-12-31: qty 2

## 2023-12-31 MED ORDER — LACTATED RINGERS IV SOLN: INTRAVENOUS | Status: DC

## 2023-12-31 MED ORDER — ACETAMINOPHEN 10 MG/ML IV SOLN: 1000.0000 mg | Freq: Once | INTRAVENOUS | Status: DC | PRN

## 2023-12-31 MED ORDER — MOXIFLOXACIN HCL 0.5 % OP SOLN
OPHTHALMIC | Status: DC | PRN
Start: 2023-12-31 — End: 2023-12-31
  Administered 2023-12-31: .2 mL via OPHTHALMIC

## 2023-12-31 SURGICAL SUPPLY — 12 items
CATARACT SUITE SIGHTPATH (MISCELLANEOUS) ×1 IMPLANT
DISSECTOR HYDRO NUCLEUS 50X22 (MISCELLANEOUS) ×1 IMPLANT
FEE CATARACT SUITE SIGHTPATH (MISCELLANEOUS) ×1 IMPLANT
GLOVE PI ULTRA LF STRL 7.5 (GLOVE) ×1 IMPLANT
GLOVE SURG POLYISOPRENE 8.5 (GLOVE) ×1 IMPLANT
GLOVE SURG PROTEXIS BL SZ6.5 (GLOVE) ×1 IMPLANT
GLOVE SURG SYN 6.5 PF PI BL (GLOVE) ×1 IMPLANT
GLOVE SURG SYN 8.5 PF PI BL (GLOVE) ×1 IMPLANT
LENS IOL TECNIS EYHANCE 21.0 (Intraocular Lens) IMPLANT
NDL FILTER BLUNT 18X1 1/2 (NEEDLE) ×1 IMPLANT
NEEDLE FILTER BLUNT 18X1 1/2 (NEEDLE) ×1 IMPLANT
SYR 3ML LL SCALE MARK (SYRINGE) ×1 IMPLANT

## 2023-12-31 NOTE — Op Note (Signed)
 OPERATIVE NOTE  James Herrera [id number] 12/31/2023   PREOPERATIVE DIAGNOSIS:  Nuclear sclerotic cataract right eye.  H25.11   POSTOPERATIVE DIAGNOSIS:    Nuclear sclerotic cataract right eye.     PROCEDURE:  Phacoemusification with posterior chamber intraocular lens placement of the right eye   LENS:   Implant Name Type Inv. Item Serial No. Manufacturer Lot No. LRB No. Used Action  LENS IOL TECNIS EYHANCE 21.0 - [id number] Intraocular Lens LENS IOL TECNIS EYHANCE 21.0 [id number] [employer]  Right 1 Implanted       Procedure(s): PHACOEMULSIFICATION, CATARACT, WITH IOL  11.29 01:03.5 (Right)  SURGEON:  Adine Novak, MD, MPH  ANESTHESIOLOGIST: Anesthesiologist: Myra Lynwood MATSU, MD CRNA: Jarvis Lew, CRNA   ANESTHESIA:  Topical with tetracaine drops augmented with 1% preservative-free intracameral lidocaine .  ESTIMATED BLOOD LOSS: less than 1 mL.   COMPLICATIONS:  None.   DESCRIPTION OF PROCEDURE:  The patient was identified in the holding room and transported to the operating room and placed in the supine position under the operating microscope.  The right eye was identified as the operative eye and it was prepped and draped in the usual sterile ophthalmic fashion.   A 1.0 millimeter clear-corneal paracentesis was made at the 10:30 position. 0.5 ml of preservative-free 1% lidocaine  with epinephrine was injected into the anterior chamber.  The anterior chamber was filled with viscoelastic.  A 2.4 millimeter keratome was used to make a near-clear corneal incision at the 8:00 position.  A curvilinear capsulorrhexis was made with a cystotome and capsulorrhexis forceps.  Balanced salt solution was used to hydrodissect and hydrodelineate the nucleus.   Phacoemulsification was then used in stop and chop fashion to remove the lens nucleus and epinucleus.  The remaining cortex was then removed using the irrigation and aspiration handpiece. Viscoelastic was then placed into the  capsular bag to distend it for lens placement.  A lens was then injected into the capsular bag.  The remaining viscoelastic was aspirated.   Wounds were hydrated with balanced salt solution.  The anterior chamber was inflated to a physiologic pressure with balanced salt solution.   Intracameral vigamox 0.1 mL undiluted was injected into the eye and a drop placed onto the ocular surface.  No wound leaks were noted.  The patient was taken to the recovery room in stable condition without complications of anesthesia or surgery  Adine Novak 12/31/2023, 10:31 AM

## 2023-12-31 NOTE — H&P (Signed)
 [facility]   Primary Care Physician:  Sadie Manna, MD Ophthalmologist: Dr. Adine Novak  Pre-Procedure History & Physical: HPI:  James Enck. is a 89 y.o. male here for cataract surgery.   Past Medical History:  Diagnosis Date   BPH (benign prostatic hyperplasia)    Difficulty balancing    History of kidney stones    History of stroke    Hypertension    Left arm weakness    Neuropathy    Sensory ataxia     Past Surgical History:  Procedure Laterality Date   HERNIA REPAIR Right    HERNIA REPAIR Left    HERNIA REPAIR     umbilical   kidney stone removal      x2   PROSTATE BIOPSY N/A 04/22/2020   Procedure: PROSTATE BIOPSY GRAYCE;  Surgeon: Kassie Ozell SAUNDERS, MD;  Location: [facility] ORS;  Service: Urology;  Laterality: N/A;   TONSILLECTOMY      Prior to Admission medications   Medication Sig Start Date End Date Taking? Authorizing Provider  cyanocobalamin 1000 MCG tablet Take 1,000 mcg by mouth daily.   Yes [provider]  finasteride (PROSCAR) 5 MG tablet Take 5 mg by mouth daily. 02/29/20  Yes [provider]  gabapentin (NEURONTIN) 400 MG capsule  08/09/22  Yes [provider]  metoprolol tartrate (LOPRESSOR) 50 MG tablet Take 50 mg by mouth in the morning and at bedtime. 01/28/20  Yes [provider]  Multiple Vitamin (MULTI-VITAMIN) tablet Take 1 tablet by mouth daily.   Yes [provider]  olmesartan-hydrochlorothiazide (BENICAR HCT) 40-25 MG tablet Take 1 tablet by mouth daily. 03/08/20  Yes [provider]  Omega 3 1200 MG CAPS Take 1,200 mg by mouth 4 (four) times a week.   Yes [provider]  Potassium 99 MG TABS Take 99 mg by mouth daily.   Yes [provider]  latanoprost (XALATAN) 0.005 % ophthalmic solution Place 1 drop into both eyes at bedtime. 01/23/20   [provider]    Allergies as of 11/06/2023   (No Known Allergies)    History reviewed. No pertinent  family history.  Social History   Socioeconomic History   Marital status: Married    Spouse name: Not on file   Number of children: Not on file   Years of education: Not on file   Highest education level: Not on file  Occupational History   Not on file  Tobacco Use   Smoking status: Former   Smokeless tobacco: Never  Substance and Sexual Activity   Alcohol use: Never   Drug use: Never   Sexual activity: Not on file  Other Topics Concern   Not on file  Social History Narrative   Not on file   Social Drivers of Health   Financial Resource Strain: Low Risk  (11/19/2023)   Received from [facility]   Overall Financial Resource Strain (CARDIA)    Difficulty of Paying Living Expenses: Not hard at all  Food Insecurity: No Food Insecurity (11/19/2023)   Received from [facility]   Hunger Vital Sign    Within the past 12 months, you worried that your food would run out before you got the money to buy more.: Never true    Within the past 12 months, the food you bought just didn't last and you didn't have money to get more.: Never true  Transportation Needs: No Transportation Needs (11/19/2023)   Received from  [facility]   PRAPARE - Transportation    In the past 12 months, has lack of transportation kept you from medical appointments or from getting medications?: No    Lack of Transportation (Non-Medical): No  Physical Activity: Not on file  Stress: Not on file  Social Connections: Not on file  Intimate Partner Violence: Not on file    Review of Systems: See HPI, otherwise negative ROS  Physical Exam: BP (!) 158/74   Pulse (!) 51   Temp 98.4 F (36.9 C) (Temporal)   Resp 12   Ht 5' 6 (1.676 m)   Wt 60.8 kg   SpO2 99%   BMI 21.63 kg/m  General:   Alert, cooperative. Head:  Normocephalic and atraumatic. Respiratory:  Normal work of breathing. Cardiovascular:  NAD  Impression/Plan: James FORBES Billy Mickey. is here  for cataract surgery.  Risks, benefits, limitations, and alternatives regarding cataract surgery have been reviewed with the patient.  Questions have been answered.  All parties agreeable.   Adine Novak, MD  12/31/2023, 10:06 AM

## 2023-12-31 NOTE — Anesthesia Preprocedure Evaluation (Signed)
 Anesthesia Evaluation  Patient identified by MRN, date of birth, ID band Patient awake    Reviewed: Allergy & Precautions, H&P , NPO status , Patient's Chart, lab work & pertinent test results, reviewed documented beta blocker date and time   Airway Mallampati: II  TM Distance: >3 FB Neck ROM: full    Dental no notable dental hx. (+) Teeth Intact   Pulmonary neg pulmonary ROS, former smoker   Pulmonary exam normal breath sounds clear to auscultation       Cardiovascular Exercise Tolerance: Good hypertension, On Medications negative cardio ROS  Rhythm:regular Rate:Normal     Neuro/Psych negative neurological ROS  negative psych ROS   GI/Hepatic negative GI ROS, Neg liver ROS,,,  Endo/Other  negative endocrine ROSdiabetes, Well Controlled    Renal/GU      Musculoskeletal   Abdominal   Peds  Hematology negative hematology ROS (+)   Anesthesia Other Findings   Reproductive/Obstetrics negative OB ROS                             Anesthesia Physical Anesthesia Plan  ASA: 2  Anesthesia Plan: MAC   Post-op Pain Management:    Induction:   PONV Risk Score and Plan: 1  Airway Management Planned:   Additional Equipment:   Intra-op Plan:   Post-operative Plan:   Informed Consent: I have reviewed the patients History and Physical, chart, labs and discussed the procedure including the risks, benefits and alternatives for the proposed anesthesia with the patient or authorized representative who has indicated his/her understanding and acceptance.       Plan Discussed with: CRNA  Anesthesia Plan Comments:        Anesthesia Quick Evaluation

## 2023-12-31 NOTE — Anesthesia Postprocedure Evaluation (Signed)
 Anesthesia Post Note  Patient: James E Diaz Jr.  Procedure(s) Performed: PHACOEMULSIFICATION, CATARACT, WITH IOL  11.29 01:03.5 (Right: Eye)  Patient location during evaluation: PACU Anesthesia Type: MAC Level of consciousness: awake and alert Pain management: pain level controlled Vital Signs Assessment: post-procedure vital signs reviewed and stable Respiratory status: spontaneous breathing, nonlabored ventilation, respiratory function stable and patient connected to nasal cannula oxygen Cardiovascular status: stable and blood pressure returned to baseline Postop Assessment: no apparent nausea or vomiting Anesthetic complications: no   No notable events documented.   Last Vitals:  Vitals:   12/31/23 1032 12/31/23 1037  BP: 100/71 133/76  Pulse: 63 69  Resp: 12 17  Temp: (!) 36.2 C (!) 36.2 C  SpO2: 98% 96%    Last Pain:  Vitals:   12/31/23 1037  TempSrc:   PainSc: 0-No pain                 Lynwood KANDICE Clause

## 2023-12-31 NOTE — Transfer of Care (Signed)
 Immediate Anesthesia Transfer of Care Note  Patient: James Herrera.  Procedure(s) Performed: PHACOEMULSIFICATION, CATARACT, WITH IOL  11.29 01:03.5 (Right: Eye)  Patient Location: PACU  Anesthesia Type:MAC  Level of Consciousness: awake and alert   Airway & Oxygen Therapy: Patient Spontanous Breathing  Post-op Assessment: Report given to RN and Post -op Vital signs reviewed and stable  Post vital signs: Reviewed and stable  Last Vitals:  Vitals Value Taken Time  BP 100/71 12/31/23 10:32  Temp 36.2 C 12/31/23 10:32  Pulse 61 12/31/23 10:34  Resp 21 12/31/23 10:34  SpO2 100 % 12/31/23 10:34  Vitals shown include unfiled device data.  Last Pain:  Vitals:   12/31/23 1032  TempSrc:   PainSc: 0-No pain         Complications: No notable events documented.

## 2024-01-01 ENCOUNTER — Encounter: Payer: Self-pay | Admitting: Ophthalmology

## 2024-01-07 ENCOUNTER — Encounter: Payer: Self-pay | Admitting: Ophthalmology

## 2024-01-07 NOTE — Anesthesia Preprocedure Evaluation (Addendum)
 Anesthesia Evaluation  Patient identified by MRN, date of birth, ID band Patient awake    Reviewed: Allergy & Precautions, H&P , NPO status , Patient's Chart, lab work & pertinent test results  Airway Mallampati: III  TM Distance: >3 FB Neck ROM: Full    Dental no notable dental hx. (+) Poor Dentition   Pulmonary former smoker   Pulmonary exam normal breath sounds clear to auscultation       Cardiovascular hypertension, Normal cardiovascular exam Rhythm:Regular Rate:Normal     Neuro/Psych negative neurological ROS  negative psych ROS   GI/Hepatic negative GI ROS, Neg liver ROS,,,  Endo/Other  negative endocrine ROS    Renal/GU negative Renal ROS  negative genitourinary   Musculoskeletal negative musculoskeletal ROS (+)    Abdominal   Peds negative pediatric ROS (+)  Hematology negative hematology ROS (+)   Anesthesia Other Findings Previous cataract 12-31-22 Dr. Myra Hypertension BPH (benign prostatic hyperplasia) History of kidney stones  History of stroke Sensory ataxia  Neuropathy Left arm weakness  Difficulty balancing    Reproductive/Obstetrics negative OB ROS                              Anesthesia Physical Anesthesia Plan  ASA: 3  Anesthesia Plan: MAC   Post-op Pain Management:    Induction: Intravenous  PONV Risk Score and Plan:   Airway Management Planned: Natural Airway and Nasal Cannula  Additional Equipment:   Intra-op Plan:   Post-operative Plan:   Informed Consent: I have reviewed the patients History and Physical, chart, labs and discussed the procedure including the risks, benefits and alternatives for the proposed anesthesia with the patient or authorized representative who has indicated his/her understanding and acceptance.     Dental Advisory Given  Plan Discussed with: Anesthesiologist, CRNA and Surgeon  Anesthesia Plan Comments: (Patient  consented for risks of anesthesia including but not limited to:  - adverse reactions to medications - damage to eyes, teeth, lips or other oral mucosa - nerve damage due to positioning  - sore throat or hoarseness - Damage to heart, brain, nerves, lungs, other parts of body or loss of life  Patient voiced understanding and assent.)         Anesthesia Quick Evaluation

## 2024-01-10 NOTE — Discharge Instructions (Signed)

## 2024-02-04 ENCOUNTER — Other Ambulatory Visit: Payer: Self-pay

## 2024-02-04 ENCOUNTER — Encounter
Admission: RE | Disposition: A | Discharge status: Home / Self Care | Payer: Self-pay | Source: Home / Self Care | Attending: Ophthalmology

## 2024-02-04 ENCOUNTER — Ambulatory Visit: Payer: Self-pay | Admitting: Anesthesiology

## 2024-02-04 ENCOUNTER — Encounter: Payer: Self-pay | Admitting: Ophthalmology

## 2024-02-04 ENCOUNTER — Ambulatory Visit
Admission: RE | Admit: 2024-02-04 | Discharge: 2024-02-04 | Disposition: A | Discharge status: Home / Self Care | Attending: Ophthalmology | Admitting: Ophthalmology

## 2024-02-04 DIAGNOSIS — H2512 Age-related nuclear cataract, left eye: Secondary | ICD-10-CM | POA: Insufficient documentation

## 2024-02-04 DIAGNOSIS — I10 Essential (primary) hypertension: Secondary | ICD-10-CM | POA: Insufficient documentation

## 2024-02-04 DIAGNOSIS — Z87891 Personal history of nicotine dependence: Secondary | ICD-10-CM | POA: Diagnosis not present

## 2024-02-04 HISTORY — PX: CATARACT EXTRACTION W/PHACO: SHX586

## 2024-02-04 SURGERY — PHACOEMULSIFICATION, CATARACT, WITH IOL INSERTION
Anesthesia: Monitor Anesthesia Care | Laterality: Left

## 2024-02-04 MED ORDER — ARMC OPHTHALMIC DILATING DROPS
1.0000 | OPHTHALMIC | Status: DC | PRN
  Administered 2024-02-04 (×3): 1 via OPHTHALMIC

## 2024-02-04 MED ORDER — MIDAZOLAM HCL 2 MG/2ML IJ SOLN
INTRAMUSCULAR | Status: DC | PRN
  Administered 2024-02-04: .5 mg via INTRAVENOUS

## 2024-02-04 MED ORDER — LACTATED RINGERS IV SOLN: INTRAVENOUS | Status: DC

## 2024-02-04 MED ORDER — MOXIFLOXACIN HCL 0.5 % OP SOLN
OPHTHALMIC | Status: DC | PRN
  Administered 2024-02-04: .2 mL via OPHTHALMIC

## 2024-02-04 MED ORDER — LIDOCAINE HCL (PF) 2 % IJ SOLN
INTRAOCULAR | Status: DC | PRN
  Administered 2024-02-04: 1 mL via INTRAOCULAR

## 2024-02-04 MED ORDER — SIGHTPATH DOSE#1 NA HYALUR & NA CHOND-NA HYALUR IO KIT
PACK | INTRAOCULAR | Status: DC | PRN
  Administered 2024-02-04: 1 via OPHTHALMIC

## 2024-02-04 MED ORDER — SIGHTPATH DOSE#1 BSS IO SOLN
INTRAOCULAR | Status: DC | PRN
Start: 2024-02-04 — End: 2024-02-04
  Administered 2024-02-04: 15 mL via INTRAOCULAR

## 2024-02-04 MED ORDER — FENTANYL CITRATE (PF) 100 MCG/2ML IJ SOLN
INTRAMUSCULAR | Status: AC
  Filled 2024-02-04: qty 2

## 2024-02-04 MED ORDER — ARMC OPHTHALMIC DILATING DROPS
OPHTHALMIC | Status: AC
  Filled 2024-02-04: qty 0.5

## 2024-02-04 MED ORDER — TETRACAINE HCL 0.5 % OP SOLN
1.0000 [drp] | OPHTHALMIC | Status: DC | PRN
  Administered 2024-02-04 (×3): 1 [drp] via OPHTHALMIC

## 2024-02-04 MED ORDER — FENTANYL CITRATE (PF) 100 MCG/2ML IJ SOLN
INTRAMUSCULAR | Status: DC | PRN
  Administered 2024-02-04 (×2): 25 ug via INTRAVENOUS

## 2024-02-04 MED ORDER — MIDAZOLAM HCL 2 MG/2ML IJ SOLN
INTRAMUSCULAR | Status: AC
  Filled 2024-02-04: qty 2

## 2024-02-04 MED ORDER — SIGHTPATH DOSE#1 BSS IO SOLN
INTRAOCULAR | Status: DC | PRN
  Administered 2024-02-04: 120 mL via OPHTHALMIC

## 2024-02-04 MED ORDER — TETRACAINE HCL 0.5 % OP SOLN
OPHTHALMIC | Status: AC
  Filled 2024-02-04: qty 4

## 2024-02-04 SURGICAL SUPPLY — 10 items
CATARACT SUITE SIGHTPATH (MISCELLANEOUS) ×1 IMPLANT
DISSECTOR HYDRO NUCLEUS 50X22 (MISCELLANEOUS) ×1 IMPLANT
FEE CATARACT SUITE SIGHTPATH (MISCELLANEOUS) ×1 IMPLANT
GLOVE PI ULTRA LF STRL 7.5 (GLOVE) ×1 IMPLANT
GLOVE SURG SYN 6.5 PF PI BL (GLOVE) ×1 IMPLANT
GLOVE SURG SYN 8.5 PF PI BL (GLOVE) ×1 IMPLANT
LENS IOL TECNIS EYHANCE 19.0 (Intraocular Lens) IMPLANT
NDL FILTER BLUNT 18X1 1/2 (NEEDLE) ×1 IMPLANT
NEEDLE FILTER BLUNT 18X1 1/2 (NEEDLE) ×1 IMPLANT
SYR 3ML LL SCALE MARK (SYRINGE) ×1 IMPLANT

## 2024-02-04 NOTE — H&P (Signed)
 [facility]   Primary Care Physician:  Sadie Manna, MD Ophthalmologist: Dr. Adine Novak  Pre-Procedure History & Physical: HPI:  James Herrera. is a 89 y.o. male here for cataract surgery.   Past Medical History:  Diagnosis Date   BPH (benign prostatic hyperplasia)    Difficulty balancing    History of kidney stones    History of stroke    Hypertension    Left arm weakness    Neuropathy    Sensory ataxia     Past Surgical History:  Procedure Laterality Date   CATARACT EXTRACTION W/PHACO Right 12/31/2023   Procedure: PHACOEMULSIFICATION, CATARACT, WITH IOL  11.29 01:03.5;  Surgeon: Novak Adine Anes, MD;  Location: [facility];  Service: Ophthalmology;  Laterality: Right;   HERNIA REPAIR Right    HERNIA REPAIR Left    HERNIA REPAIR     umbilical   kidney stone removal      x2   PROSTATE BIOPSY N/A 04/22/2020   Procedure: PROSTATE BIOPSY GRAYCE;  Surgeon: Kassie Ozell SAUNDERS, MD;  Location: [facility];  Service: Urology;  Laterality: N/A;   TONSILLECTOMY      Prior to Admission medications   Medication Sig Start Date End Date Taking? Authorizing Provider  cyanocobalamin 1000 MCG tablet Take 1,000 mcg by mouth daily.   Yes [provider]  finasteride (PROSCAR) 5 MG tablet Take 5 mg by mouth daily. 02/29/20  Yes [provider]  gabapentin (NEURONTIN) 400 MG capsule  08/09/22  Yes [provider]  metoprolol tartrate (LOPRESSOR) 50 MG tablet Take 50 mg by mouth in the morning and at bedtime. 01/28/20  Yes [provider]  Multiple Vitamin (MULTI-VITAMIN) tablet Take 1 tablet by mouth daily.   Yes [provider]  olmesartan-hydrochlorothiazide (BENICAR HCT) 40-25 MG tablet Take 1 tablet by mouth daily. 03/08/20  Yes [provider]  Omega 3 1200 MG CAPS Take 1,200 mg by mouth 4 (four) times a week.   Yes [provider]  Potassium 99 MG TABS Take 99 mg by mouth daily.   Yes [provider]  latanoprost (XALATAN) 0.005 % ophthalmic solution Place 1 drop into both eyes at bedtime. 01/23/20   [provider]    Allergies as of 11/06/2023   (No Known Allergies)    History reviewed. No pertinent family history.  Social History   Socioeconomic History   Marital status: Married    Spouse name: Not on file   Number of children: Not on file   Years of education: Not on file   Highest education level: Not on file  Occupational History   Not on file  Tobacco Use   Smoking status: Former   Smokeless tobacco: Never  Substance and Sexual Activity   Alcohol use: Never   Drug use: Never   Sexual activity: Not on file  Other Topics Concern   Not on file  Social History Narrative   Not on file   Social Drivers of Health   Financial Resource Strain: Low Risk  (11/19/2023)   Received from [facility]   Overall Financial Resource Strain (CARDIA)    Difficulty of Paying Living Expenses: Not hard at all  Food Insecurity: No Food Insecurity (11/19/2023)   Received from [facility]   Hunger Vital Sign    Within the past 12 months, you worried that your food would run out before you got the money to buy more.: Never true  Within the past 12 months, the food you bought just didn't last and you didn't have money to get more.: Never true  Transportation Needs: No Transportation Needs (11/19/2023)   Received from [facility]    In the past 12 months, has lack of transportation kept you from medical appointments or from getting medications?: No    Lack of Transportation (Non-Medical): No  Physical Activity: Not on file  Stress: Not on file  Social Connections: Not on file  Intimate Partner Violence: Not on file    Review of Systems: See HPI, otherwise negative ROS  Physical Exam: BP (!) 168/90   Pulse 68   Temp (!) 97.1 F (36.2 C) (Temporal)   Resp 20   Ht 5' 6  (1.676 m)   Wt 58.5 kg   SpO2 95%   BMI 20.82 kg/m  General:   Alert, cooperative. Head:  Normocephalic and atraumatic. Respiratory:  Normal work of breathing. Cardiovascular:  NAD  Impression/Plan: James Herrera. is here for cataract surgery.  Risks, benefits, limitations, and alternatives regarding cataract surgery have been reviewed with the patient.  Questions have been answered.  All parties agreeable.   Adine Novak, MD  02/04/2024, 12:11 PM

## 2024-02-04 NOTE — Op Note (Signed)
 OPERATIVE NOTE  Jguadalupe Opiela [id number] 02/04/2024   PREOPERATIVE DIAGNOSIS:  Nuclear sclerotic cataract left eye.  H25.12   POSTOPERATIVE DIAGNOSIS:    Nuclear sclerotic cataract left eye.     PROCEDURE:  Phacoemusification with posterior chamber intraocular lens placement of the left eye   LENS:   Implant Name Type Inv. Item Serial No. Manufacturer Lot No. LRB No. Used Action  LENS IOL TECNIS EYHANCE 19.0 - [id number] Intraocular Lens LENS IOL TECNIS EYHANCE 19.0 [id number] [employer]  Left 1 Implanted      Procedure(s): PHACOEMULSIFICATION, CATARACT, WITH IOL INSERTION 7.28, 00:43.6 (Left)  SURGEON:  Adine Novak, MD, MPH   ANESTHESIA:  Topical with tetracaine  drops augmented with 1% preservative-free intracameral lidocaine .  ESTIMATED BLOOD LOSS: <1 mL   COMPLICATIONS:  None.   DESCRIPTION OF PROCEDURE:  The patient was identified in the holding room and transported to the operating room and placed in the supine position under the operating microscope.  The left eye was identified as the operative eye and it was prepped and draped in the usual sterile ophthalmic fashion.   A 1.0 millimeter clear-corneal paracentesis was made at the 5:00 position. 0.5 ml of preservative-free 1% lidocaine  with epinephrine  was injected into the anterior chamber.  The anterior chamber was filled with viscoelastic.  A 2.4 millimeter keratome was used to make a near-clear corneal incision at the 2:00 position.  A curvilinear capsulorrhexis was made with a cystotome and capsulorrhexis forceps.  Balanced salt solution was used to hydrodissect and hydrodelineate the nucleus.   Phacoemulsification was then used in stop and chop fashion to remove the lens nucleus and epinucleus.  The remaining cortex was then removed using the irrigation and aspiration handpiece. Viscoelastic was then placed into the capsular bag to distend it for lens placement.  A lens was then injected into the capsular bag.   The remaining viscoelastic was aspirated.   Wounds were hydrated with balanced salt solution.  The anterior chamber was inflated to a physiologic pressure with balanced salt solution.  Intracameral vigamox  0.1 mL undiltued was injected into the eye and a drop placed onto the ocular surface.  No wound leaks were noted.  The patient was taken to the recovery room in stable condition without complications of anesthesia or surgery  Adine Novak 02/04/2024, 12:44 PM

## 2024-02-04 NOTE — Transfer of Care (Signed)
 Immediate Anesthesia Transfer of Care Note  Patient: James Herrera.  Procedure(s) Performed: PHACOEMULSIFICATION, CATARACT, WITH IOL INSERTION 7.28, 00:43.6 (Left)  Patient Location: PACU  Anesthesia Type: MAC  Level of Consciousness: awake, alert  and patient cooperative  Airway and Oxygen Therapy: Patient Spontanous Breathing and Patient connected to supplemental oxygen  Post-op Assessment: Post-op Vital signs reviewed, Patient's Cardiovascular Status Stable, Respiratory Function Stable, Patent Airway and No signs of Nausea or vomiting  Post-op Vital Signs: Reviewed and stable  Complications: No notable events documented.

## 2024-02-04 NOTE — Anesthesia Postprocedure Evaluation (Signed)
 Anesthesia Post Note  Patient: James E Halliday Jr.  Procedure(s) Performed: PHACOEMULSIFICATION, CATARACT, WITH IOL INSERTION 7.28, 00:43.6 (Left)  Patient location during evaluation: PACU Anesthesia Type: MAC Level of consciousness: awake and alert Pain management: pain level controlled Vital Signs Assessment: post-procedure vital signs reviewed and stable Respiratory status: spontaneous breathing, nonlabored ventilation, respiratory function stable and patient connected to nasal cannula oxygen Cardiovascular status: stable and blood pressure returned to baseline Postop Assessment: no apparent nausea or vomiting Anesthetic complications: no   No notable events documented.   Last Vitals:  Vitals:   02/04/24 1246 02/04/24 1251  BP: 133/81 (!) 152/87  Pulse: 71 61  Resp: 17 20  Temp: (!) 36.1 C (!) 36.1 C  SpO2: 94% 95%    Last Pain:  Vitals:   02/04/24 1251  TempSrc:   PainSc: 0-No pain                 Santana Gosdin C Kalifa Cadden

## 2024-05-20 ENCOUNTER — Encounter: Payer: Self-pay | Admitting: Physical Therapy

## 2024-05-20 ENCOUNTER — Other Ambulatory Visit: Payer: Self-pay

## 2024-05-20 ENCOUNTER — Ambulatory Visit: Attending: Neurology | Admitting: Physical Therapy

## 2024-05-20 DIAGNOSIS — R2681 Unsteadiness on feet: Secondary | ICD-10-CM | POA: Diagnosis present

## 2024-05-20 DIAGNOSIS — M6281 Muscle weakness (generalized): Secondary | ICD-10-CM | POA: Diagnosis present

## 2024-05-20 DIAGNOSIS — R2689 Other abnormalities of gait and mobility: Secondary | ICD-10-CM | POA: Insufficient documentation

## 2024-05-20 DIAGNOSIS — R278 Other lack of coordination: Secondary | ICD-10-CM | POA: Insufficient documentation

## 2024-05-20 DIAGNOSIS — R262 Difficulty in walking, not elsewhere classified: Secondary | ICD-10-CM | POA: Diagnosis present

## 2024-05-20 NOTE — Therapy (Signed)
 OUTPATIENT PHYSICAL THERAPY NEURO EVALUATION   Patient Name: Colton Tassin. MRN: [id number] DOB:11/22/1933, 89 y.o., male Today's Date: 05/20/2024   PCP: Sadie Manna, MD  REFERRING PROVIDER: Lane Arthea FORBES, MD  END OF SESSION:  PT End of Session - 05/20/24 1607     Visit Number 1    Number of Visits 24    Date for Recertification  08/12/24    Progress Note Due on Visit 10    PT Start Time 1147    PT Stop Time 1230    PT Time Calculation (min) 43 min    Equipment Utilized During Treatment Gait belt    Activity Tolerance Patient tolerated treatment well    Behavior During Therapy WFL for tasks assessed/performed          Past Medical History:  Diagnosis Date   BPH (benign prostatic hyperplasia)    Difficulty balancing    History of kidney stones    History of stroke    Hypertension    Left arm weakness    Neuropathy    Sensory ataxia    Past Surgical History:  Procedure Laterality Date   CATARACT EXTRACTION W/PHACO Right 12/31/2023   Procedure: PHACOEMULSIFICATION, CATARACT, WITH IOL  11.29 01:03.5;  Surgeon: Myrna Adine Anes, MD;  Location: [facility];  Service: Ophthalmology;  Laterality: Right;   CATARACT EXTRACTION W/PHACO Left 02/04/2024   Procedure: PHACOEMULSIFICATION, CATARACT, WITH IOL INSERTION 7.28, 00:43.6;  Surgeon: Myrna Adine Anes, MD;  Location: [facility];  Service: Ophthalmology;  Laterality: Left;   HERNIA REPAIR Right    HERNIA REPAIR Left    HERNIA REPAIR     umbilical   kidney stone removal      x2   PROSTATE BIOPSY N/A 04/22/2020   Procedure: PROSTATE BIOPSY GRAYCE;  Surgeon: Kassie Ozell SAUNDERS, MD;  Location: [facility];  Service: Urology;  Laterality: N/A;   TONSILLECTOMY     There are no active problems to display for this patient.   ONSET DATE: >6 months  REFERRING DIAG: Per recent progress note: Leg weakness and imbalance  THERAPY DIAG:  Other abnormalities of gait and mobility  Other  lack of coordination  Difficulty in walking, not elsewhere classified  Muscle weakness (generalized)  Unsteadiness on feet  Rationale for Evaluation and Treatment: Rehabilitation  SUBJECTIVE:                                                                                                                                                                                             SUBJECTIVE STATEMENT: Pt reports wife suggested he return to therapy. Pt reports after  last bout of therapy, he had reached his limit of improvement, but decided he wanted to try again after his balance has declined since then. Pt reports he has trouble holding things in his hand, and that grasping is difficult. Pt reports his L arm and hand have been the biggest problem for him. Pt reports he notices that when he crosses a line on the floor he has visual issues where he has to be careful to prevent LOB. Pt reports 5  falls within the last 2 years due to carelessness and dizziness. Reports his tendency when falling is backwards. Reports issues with dressing and putting pants on, and that he uses pull on pants.  Pt accompanied by: wife  PERTINENT HISTORY: Per recent progress note by Sherran Milon Berliner, PA: LEFT ARM WEAKNESS/ BALANCE DIFFICULTY/ HISTORY OF STROKE / SENSORY ATAXIA/ BACK PAIN   Ongoing  Patient with past medical history significant for stroke presents for follow-up accordingly, also follow-up of neuropathy and tremor.  Since last office visit he has self tapered off of gabapentin to 400 mg nightly.  His primary concern today is ongoing bilateral hand tremor, worse in his left and with action or increased stress.  He also notes imbalance.  Referral to physical therapy for leg weakness and imbalance  PAIN:  Are you having pain? Yes: NPRS scale: 4-5/10 Pain location: L hand and UE Pain description: tight, aching Aggravating factors: none Relieving factors: sometimes 1 aspirin and  NSAIDs  PRECAUTIONS: Fall  RED FLAGS: None   WEIGHT BEARING RESTRICTIONS: No  FALLS: Has patient fallen in last 6 months? Yes. Number of falls 5 and within last 2 years, noting mostly in the backwards direction, or when stepping over lines.  LIVING ENVIRONMENT: Lives with: lives with their spouse Lives in: House/apartment Stairs: 2+2 steps, railing and grab bars on both sides Has following equipment at home: Single point cane and Quad cane small base  PLOF: Requires assistive device for independence, Needs assistance with ADLs, Needs assistance with homemaking, Needs assistance with gait, Needs assistance with transfers, and wife reports assisting him with most tasks, but he could do them himself if he needed to  PATIENT GOALS: Improve balance, work on improving the L UE and hand  OBJECTIVE:  Note: Objective measures were completed at Evaluation unless otherwise noted.  DIAGNOSTIC FINDINGS:  MRI 01/2022: IMPRESSION: 1. No specific or reversible cause for symptoms. 2. Brain atrophy and chronic small vessel ischemia without notable progression from 2022.    MRI 05/2021: IMPRESSION: No evidence of acute intracranial abnormality.   Small chronic cortical infarct within the high posterior left frontal lobe.   Small chronic lacunar infarcts within the bilateral basal ganglia.   Mild chronic small vessel ischemic changes within the cerebral white matter and pons.   Mild-to-moderate generalized cerebral atrophy. Comparatively mild cerebellar atrophy.   Minimal bilateral ethmoid and right maxillary sinus mucosal thickening.    COGNITION: Overall cognitive status: Within functional limits for tasks assessed   SENSATION: Light touch: Impaired  and L upper thigh pt reports no sensation   COORDINATION: RAMPs: L side impaired, jerky and slower compared to R side H<>Shin: WNL BLE F<>N: impaired L side, extended time taken to initially raise arm and find finger and  chin  POSTURE: rounded shoulders, forward head, and increased thoracic kyphosis  LOWER EXTREMITY ROM:     Active  Right Eval Left Eval  Hip flexion [facility]  Hip extension    Hip abduction    Hip  adduction    Hip internal rotation    Hip external rotation    Knee flexion [facility] WFL  Knee extension [facility]  Ankle dorsiflexion [facility]  Ankle plantarflexion    Ankle inversion    Ankle eversion     (Blank rows = not tested)  LOWER EXTREMITY MMT:    MMT Right Eval Left Eval  Hip flexion 5 5  Hip extension    Hip abduction    Hip adduction    Hip internal rotation    Hip external rotation    Knee flexion 5 5  Knee extension 5 5  Ankle dorsiflexion 4+ 4+  Ankle plantarflexion    Ankle inversion    Ankle eversion    (Blank rows = not tested)  UPPER EXTREMITY MMT:  MMT Right eval Left eval  Shoulder flexion 5 4  Shoulder extension    Shoulder abduction    Shoulder adduction    Shoulder extension    Shoulder internal rotation    Shoulder external rotation    Elbow flexion 5 4+  Elbow extension 5 4+  Wrist flexion    Wrist extension    Wrist ulnar deviation    Wrist radial deviation    Wrist pronation    Wrist supination     (Blank rows = not tested)   BED MOBILITY:  Not tested  TRANSFERS: Sit to stand: CGA and Min A  Assistive device utilized: Corporate treasurer base and arm rest     Stand to sit: CGA  Assistive device utilized: Counselling psychologist and arm rest     Chair to chair: CGA  Assistive device utilized: Counselling psychologist and arm rest       STAIRS: Not tested GAIT: Findings: Gait Characteristics: decreased arm swing- Right, decreased arm swing- Left, decreased step length- Right, decreased step length- Left, decreased stride length, decreased hip/knee flexion- Right, and decreased hip/knee flexion- Left, Distance walked: 64m, Assistive device utilized:Quad cane small base and None, Level of assistance: CGA and Min A, and Comments: Shuffling  pattern upon gait initiation, pt reports freezing of gait when steeping over lines and changed surfaces, not visualized during this assessment   FUNCTIONAL TESTS:  -Pt performed 5 time sit<>stand (5xSTS): 16.62 sec (>15 sec indicates increased fall risk)  -10 Meter Walk Test: Patient instructed to walk 10 meters (32.8 ft) as quickly and as safely as possible at their normal speed x2 and at a fast speed x2. Time measured from 2 meter mark to 8 meter mark to accommodate ramp-up and ramp-down.  Normal speed 1: 0.432 m/s Normal speed 2: 0.446 m/s Average Normal speed: 0.439 m/s Cut off scores: <0.4 m/s = household Ambulator, 0.4-0.8 m/s = limited community Ambulator, >0.8 m/s = community Ambulator, >1.2 m/s = crossing a street, <1.0 = increased fall risk MCID 0.05 m/s (small), 0.13 m/s (moderate), 0.06 m/s (significant)  (ANPTA Core Set of Outcome Measures for Adults with Neurologic Conditions, 2018)   -PT instructed pt in TUG: 21.16s without cane, 25.65s with cane sec (average of 3 trials; >13.5 sec indicates increased fall risk)   Berg Balance Scale:  Item Date:  Date:   Sitting to standing Insert SmartPhrase OPRCBERGREEVAL Insert SmartPhrase OPRCBERGREEVAL  2. Standing unsupported    3. Sitting with back unsupported, feet supported    4. Standing to sitting    5. Pivot transfer     6. Standing unsupported with eyes closed    7. Standing unsupported with  feet together    8. Reaching forward with outstretched arms while standing    9. Pick up object from the floor from standing    10. Turning to look behind over left and right shoulders while standing    11. Turn 360 degrees    12. Place alternate foot on step or stool while standing unsupported    13. Standing unsupported one foot in front    14. Standing on one leg     Total Score:    Total Score:     Functional gait assessment:  FUNCTIONAL GAIT ASSESSMENT   Date:    GAIT LEVEL SURFACE Instructions: Walk at your normal speed from  here to the next mark (6 m) [20 ft].   2.   CHANGE IN GAIT SPEED Instructions: Begin walking at your normal pace (for 1.5 m [5 ft]). When I tell you "go," walk as fast as you can (for 1.5 m [5 ft]). When I tell you "slow," walk as slowly as you can (for 1.5 m [5 ft].   3.    GAIT WITH HORIZONTAL HEAD TURNS Instructions: Walk from here to the next mark 6 m (20 ft) away. Begin walking at your normal pace. Keep walking straight; after 3 steps, turn your head to the right and keep walking straight while looking to the right. After 3 more steps, turn your head to the left and keep walking straight while looking left. Continue alternating looking right and left.   4.   GAIT WITH VERTICAL HEAD TURNS Instructions: Walk from here to the next mark (6 m [20 ft]). Begin walking at your normal pace. Keep walking straight; after 3 steps, tip your head up and keep walking straight while looking up. After 3 more steps, tip your head down, keep walking straight while looking down. Continue  alternating looking up and down every 3 steps until you have completed 2 repetitions in each direction.   5.  GAIT AND PIVOT TURN Instructions: Begin with walking at your normal pace. When I tell you, "turn and stop," turn as quickly as you can to face the opposite direction and stop.   6.   STEP OVER OBSTACLE Instructions: Begin walking at your normal speed. When you come to the shoe box, step over it, not around it, and keep walking.   7.   GAIT WITH NARROW BASE OF SUPPORT Instructions: Walk on the floor with arms folded across the chest, feet aligned heel to toe in tandem for a distance of 3.6 m [12 ft]. The number of steps taken in a straight line are counted for a maximum of 10 steps.   8.   GAIT WITH EYES CLOSED Instructions: Walk at your normal speed from here to the next mark (6 m [20 ft]) with your eyes closed.   9.   AMBULATING BACKWARDS Instructions: Walk backwards until I tell you to stop   10. STEPS Instructions:  Walk up these stairs as you would at home (ie, using the rail if necessary). At the top turn around and walk down.   Total    Interpretation of scores: Non-Specific Older Adults Cutoff Score: <=22/30 = risk of falls Parkinson's Disease Cutoff score <15/30= fall risk (Hoehn & Yahr 1-4)  Minimally Clinically Important Difference (MCID)  Stroke (acute, subacute, and chronic) = MDC: 4.2 points Vestibular (acute) = MDC: 6 points Community Dwelling Older Adults =  MCID: 4 points Parkinson's Disease  =  MDC: 4.3 points  (Academy of Neurologic  Physical Therapy (nd). Functional Gait Assessment. Retrieved from https://www.neuropt.org/docs/default-source/cpgs/core-outcome-measures/function-gait-assessment-pocket-guide-proof9-(2).pdf?sfvrsn=b54f35043_0.)  PATIENT SURVEYS:  ABC scale: The Activities-Specific Balance Confidence (ABC) Scale 0% 10 20 30  40 50 60 70 80 90 100% No confidence<->completely confident  "How confident are you that you will not lose your balance or become unsteady when you . . .   Date tested   Walk around the house   2. Walk up or down stairs   3. Bend over and pick up a slipper from in front of a closet floor   4. Reach for a small can off a shelf at eye level   5. Stand on tip toes and reach for something above your head   6. Stand on a chair and reach for something   7. Sweep the floor   8. Walk outside the house to a car parked in the driveway   9. Get into or out of a car   10. Walk across a parking lot to the mall   11. Walk up or down a ramp   12. Walk in a crowded mall where people rapidly walk past you   13. Are bumped into by people as you walk through the mall   14. Step onto or off of an escalator while you are holding onto the railing   15. Step onto or off an escalator while holding onto parcels such that you cannot hold onto the railing   16. Walk outside on icy sidewalks   Total: #/16                                                                                                                                   TREATMENT DATE: 05/20/2024  PT Evaluation  PATIENT EDUCATION: Education details: Pt educated on possibility of benefit on OT for L UE and hand therapy, educated on obtaining referral from MD for OT, interpretation of outcome measures Person educated: Patient and Spouse Education method: Explanation Education comprehension: verbalized understanding  HOME EXERCISE PROGRAM: Provide next session.  GOALS: Goals reviewed with patient? No  SHORT TERM GOALS: Target date: 06/17/2024  Patient will be independent with home exercise program to improve strength/mobility for increased functional independence with ADLs and mobility. Baseline: Goal status: INITIAL  SHORT TERM GOALS: Target date: 06/17/2024    Patient will be independent in home exercise program to improve strength/mobility for better functional independence with ADLs. Baseline: Goal status: INITIAL   LONG TERM GOALS: Target date: 08/12/2024    Patient will increase ABC scale score >80% to demonstrate better functional mobility and better confidence with ADLs.  Baseline:  Goal status: INITIAL  2.  Patient (> 60 years old) will complete five times sit to stand test in < 15 seconds indicating an increased LE strength and improved balance. Baseline: 16.62s Goal status: INITIAL  3.  Patient will increase Berg Balance score by > 6 points to demonstrate decreased fall risk during functional activities  Baseline:  Goal status: INITIAL  4.  Patient will increase 10 meter walk test to >1.29m/s as to improve gait speed for better community ambulation and to reduce fall risk. Baseline: 13.89 (0.472m/s), 13.46 (0.478m/s) AVG: 0.478m/s Goal status: INITIAL  5.  Patient will reduce timed up and go to <11 seconds to reduce fall risk and demonstrate improved transfer/gait ability. Baseline: 21.16s without cane, 25.65s with cane Goal status: INITIAL  6.  Patient will increase  dynamic gait index score to >19/24 as to demonstrate reduced fall risk and improved dynamic gait balance for better safety with community/home ambulation.   Baseline:  Goal status: INITIAL    ASSESSMENT:  CLINICAL IMPRESSION: Patient is a 89 y.o. male who was seen today for physical therapy evaluation and treatment for difficulty with balance and left arm weakness. Pt presentation during evaluation includes decreased sensation in L LE upper thigh, decreased UE strength and coordination in the hand specifically, seen by impaired RAMP and F<>N testing. Pt presents with shuffling gait pattern at initiation of gait, followed by decreased step and stride length, decreased L and R arm swing, decreased thoracic rotation, and decreased hip and knee flexion during ambulation. Pt shows significant tremor in L hand, preventing grasping and holding activities and causing increased discomfort. Pt performed 5 time sit<>stand in 16.62 sec, indicating increased fall risk. Pt performance of showed average gait speed after two trials as 0.428m/s, classifying him as a limited community ambulator. Pt TUG score of 21.16s without cane, 25.65s with cane, indicates increased fall risk. Patient will benefit from continued skilled physical therapy to address coordination and balance deficits to improve quality of life.  OBJECTIVE IMPAIRMENTS: Abnormal gait, decreased activity tolerance, decreased balance, decreased coordination, decreased endurance, decreased mobility, difficulty walking, decreased ROM, decreased safety awareness, dizziness, hypomobility, increased fascial restrictions, impaired perceived functional ability, increased muscle spasms, impaired flexibility, impaired sensation, improper body mechanics, and postural dysfunction.   ACTIVITY LIMITATIONS: carrying, lifting, bending, squatting, stairs, transfers, bathing, toileting, dressing, hygiene/grooming, locomotion level, and caring for  others  PARTICIPATION LIMITATIONS: cleaning, laundry, driving, shopping, and yard work  PERSONAL FACTORS: Age, Education, Time since onset of injury/illness/exacerbation, and 3+ comorbidities: stroke, HTN, BPH are also affecting patient's functional outcome.   REHAB POTENTIAL: Good  CLINICAL DECISION MAKING: Evolving/moderate complexity  EVALUATION COMPLEXITY: Moderate  PLAN:  PT FREQUENCY: 2x/week  PT DURATION: 12 weeks  PLANNED INTERVENTIONS: 97164- PT Re-evaluation, 97750- Physical Performance Testing, 97110-Therapeutic exercises, 97530- Therapeutic activity, 97112- Neuromuscular re-education, 97535- Self Care, [id number]- Manual therapy, [phone]- Gait training, [phone]- Aquatic Therapy, [phone]- Electrical stimulation (unattended), 20560 (1-2 muscles), 20561 (3+ muscles)- Dry Needling, Patient/Family education, Balance training, Stair training, Joint mobilization, Joint manipulation, Spinal mobilization, Vestibular training, Visual/preceptual remediation/compensation, DME instructions, Wheelchair mobility training, Cryotherapy, and Moist heat  PLAN FOR NEXT SESSION:  -Complete BERG, FGA, and ABC questionnaire -review goals w/ patient -Provide HEP and instruction -begin balance/ambulation/coordination activities  Renna Helling, SPT 05/20/2024, 5:38 PM   Massie Dollar PT, DPT  Physical Therapist - [facility]  3:48 PM 05/21/24

## 2024-05-21 NOTE — Addendum Note (Signed)
 Addended by: Laban Orourke E on: 05/21/2024 03:53 PM   Modules accepted: Orders

## 2024-05-22 ENCOUNTER — Ambulatory Visit

## 2024-05-22 NOTE — Therapy (Signed)
 OUTPATIENT PHYSICAL THERAPY NEURO TREATMENT   Patient Name: James Herrera. MRN: [id number] DOB:September 27, 1933, 89 y.o., male Today's Date: 05/26/2024   PCP: Sadie Manna, MD  REFERRING PROVIDER: Lane Arthea FORBES, MD  END OF SESSION:  PT End of Session - 05/26/24 0802     Visit Number 2    Number of Visits 24    Date for Recertification  08/12/24    Progress Note Due on Visit 10    PT Start Time 0934    PT Stop Time 1014    PT Time Calculation (min) 40 min    Equipment Utilized During Treatment Gait belt    Activity Tolerance Patient tolerated treatment well    Behavior During Therapy WFL for tasks assessed/performed           Past Medical History:  Diagnosis Date   BPH (benign prostatic hyperplasia)    Difficulty balancing    History of kidney stones    History of stroke    Hypertension    Left arm weakness    Neuropathy    Sensory ataxia    Past Surgical History:  Procedure Laterality Date   CATARACT EXTRACTION W/PHACO Right 12/31/2023   Procedure: PHACOEMULSIFICATION, CATARACT, WITH IOL  11.29 01:03.5;  Surgeon: Myrna Adine Anes, MD;  Location: [facility];  Service: Ophthalmology;  Laterality: Right;   CATARACT EXTRACTION W/PHACO Left 02/04/2024   Procedure: PHACOEMULSIFICATION, CATARACT, WITH IOL INSERTION 7.28, 00:43.6;  Surgeon: Myrna Adine Anes, MD;  Location: [facility];  Service: Ophthalmology;  Laterality: Left;   HERNIA REPAIR Right    HERNIA REPAIR Left    HERNIA REPAIR     umbilical   kidney stone removal      x2   PROSTATE BIOPSY N/A 04/22/2020   Procedure: PROSTATE BIOPSY GRAYCE;  Surgeon: Kassie Ozell SAUNDERS, MD;  Location: [facility];  Service: Urology;  Laterality: N/A;   TONSILLECTOMY     There are no active problems to display for this patient.   ONSET DATE: >6 months  REFERRING DIAG: Per recent progress note: Leg weakness and imbalance  THERAPY DIAG:  Other abnormalities of gait and mobility  Other  lack of coordination  Difficulty in walking, not elsewhere classified  Muscle weakness (generalized)  Unsteadiness on feet  Rationale for Evaluation and Treatment: Rehabilitation  SUBJECTIVE:                                                                                                                                                                                             SUBJECTIVE STATEMENT: From Today: Patient states balance is not so good.  From EVAL: Pt reports wife suggested he return to therapy. Pt reports after last bout of therapy, he had reached his limit of improvement, but decided he wanted to try again after his balance has declined since then. Pt reports he has trouble holding things in his hand, and that grasping is difficult. Pt reports his L arm and hand have been the biggest problem for him. Pt reports he notices that when he crosses a line on the floor he has visual issues where he has to be careful to prevent LOB. Pt reports 5  falls within the last 2 years due to carelessness and dizziness. Reports his tendency when falling is backwards. Reports issues with dressing and putting pants on, and that he uses pull on pants.  Pt accompanied by: wife  PERTINENT HISTORY: Per recent progress note by Sherran Milon Berliner, PA: LEFT ARM WEAKNESS/ BALANCE DIFFICULTY/ HISTORY OF STROKE / SENSORY ATAXIA/ BACK PAIN   Ongoing  Patient with past medical history significant for stroke presents for follow-up accordingly, also follow-up of neuropathy and tremor.  Since last office visit he has self tapered off of gabapentin to 400 mg nightly.  His primary concern today is ongoing bilateral hand tremor, worse in his left and with action or increased stress.  He also notes imbalance.  Referral to physical therapy for leg weakness and imbalance  PAIN:  Are you having pain? Yes: NPRS scale: 4-5/10 Pain location: L hand and UE Pain description: tight, aching Aggravating factors:  none Relieving factors: sometimes 1 aspirin and NSAIDs  PRECAUTIONS: Fall  RED FLAGS: None   WEIGHT BEARING RESTRICTIONS: No  FALLS: Has patient fallen in last 6 months? Yes. Number of falls 5 and within last 2 years, noting mostly in the backwards direction, or when stepping over lines.  LIVING ENVIRONMENT: Lives with: lives with their spouse Lives in: House/apartment Stairs: 2+2 steps, railing and grab bars on both sides Has following equipment at home: Single point cane and Quad cane small base  PLOF: Requires assistive device for independence, Needs assistance with ADLs, Needs assistance with homemaking, Needs assistance with gait, Needs assistance with transfers, and wife reports assisting him with most tasks, but he could do them himself if he needed to  PATIENT GOALS: Improve balance, work on improving the L UE and hand  OBJECTIVE:  Note: Objective measures were completed at Evaluation unless otherwise noted.  DIAGNOSTIC FINDINGS:  MRI 01/2022: IMPRESSION: 1. No specific or reversible cause for symptoms. 2. Brain atrophy and chronic small vessel ischemia without notable progression from 2022.    MRI 05/2021: IMPRESSION: No evidence of acute intracranial abnormality.   Small chronic cortical infarct within the high posterior left frontal lobe.   Small chronic lacunar infarcts within the bilateral basal ganglia.   Mild chronic small vessel ischemic changes within the cerebral white matter and pons.   Mild-to-moderate generalized cerebral atrophy. Comparatively mild cerebellar atrophy.   Minimal bilateral ethmoid and right maxillary sinus mucosal thickening.    COGNITION: Overall cognitive status: Within functional limits for tasks assessed   SENSATION: Light touch: Impaired  and L upper thigh pt reports no sensation   COORDINATION: RAMPs: L side impaired, jerky and slower compared to R side H<>Shin: WNL BLE F<>N: impaired L side, extended time taken  to initially raise arm and find finger and chin  POSTURE: rounded shoulders, forward head, and increased thoracic kyphosis  LOWER EXTREMITY ROM:     Active  Right Eval Left Eval  Hip flexion [facility]  WFL  Hip extension    Hip abduction    Hip adduction    Hip internal rotation    Hip external rotation    Knee flexion [facility] WFL  Knee extension [facility]  Ankle dorsiflexion [facility]  Ankle plantarflexion    Ankle inversion    Ankle eversion     (Blank rows = not tested)  LOWER EXTREMITY MMT:    MMT Right Eval Left Eval  Hip flexion 5 5  Hip extension    Hip abduction    Hip adduction    Hip internal rotation    Hip external rotation    Knee flexion 5 5  Knee extension 5 5  Ankle dorsiflexion 4+ 4+  Ankle plantarflexion    Ankle inversion    Ankle eversion    (Blank rows = not tested)  UPPER EXTREMITY MMT:  MMT Right eval Left eval  Shoulder flexion 5 4  Shoulder extension    Shoulder abduction    Shoulder adduction    Shoulder extension    Shoulder internal rotation    Shoulder external rotation    Elbow flexion 5 4+  Elbow extension 5 4+  Wrist flexion    Wrist extension    Wrist ulnar deviation    Wrist radial deviation    Wrist pronation    Wrist supination     (Blank rows = not tested)   BED MOBILITY:  Not tested  TRANSFERS: Sit to stand: CGA and Min A  Assistive device utilized: Corporate treasurer base and arm rest     Stand to sit: CGA  Assistive device utilized: Counselling psychologist and arm rest     Chair to chair: CGA  Assistive device utilized: Counselling psychologist and arm rest       STAIRS: Not tested GAIT: Findings: Gait Characteristics: decreased arm swing- Right, decreased arm swing- Left, decreased step length- Right, decreased step length- Left, decreased stride length, decreased hip/knee flexion- Right, and decreased hip/knee flexion- Left, Distance walked: 68m, Assistive device utilized:Quad cane small base and None, Level of assistance:  CGA and Min A, and Comments: Shuffling pattern upon gait initiation, pt reports freezing of gait when steeping over lines and changed surfaces, not visualized during this assessment   FUNCTIONAL TESTS:  -Pt performed 5 time sit<>stand (5xSTS): 16.62 sec (>15 sec indicates increased fall risk)  -10 Meter Walk Test: Patient instructed to walk 10 meters (32.8 ft) as quickly and as safely as possible at their normal speed x2 and at a fast speed x2. Time measured from 2 meter mark to 8 meter mark to accommodate ramp-up and ramp-down.  Normal speed 1: 0.432 m/s Normal speed 2: 0.446 m/s Average Normal speed: 0.439 m/s Cut off scores: <0.4 m/s = household Ambulator, 0.4-0.8 m/s = limited community Ambulator, >0.8 m/s = community Ambulator, >1.2 m/s = crossing a street, <1.0 = increased fall risk MCID 0.05 m/s (small), 0.13 m/s (moderate), 0.06 m/s (significant)  (ANPTA Core Set of Outcome Measures for Adults with Neurologic Conditions, 2018)   -PT instructed pt in TUG: 21.16s without cane, 25.65s with cane sec (average of 3 trials; >13.5 sec indicates increased fall risk)   Berg Balance Scale:  Item Date:  Date:   Sitting to standing Insert SmartPhrase OPRCBERGREEVAL Insert SmartPhrase OPRCBERGREEVAL  2. Standing unsupported    3. Sitting with back unsupported, feet supported    4. Standing to sitting    5. Pivot transfer  6. Standing unsupported with eyes closed    7. Standing unsupported with feet together    8. Reaching forward with outstretched arms while standing    9. Pick up object from the floor from standing    10. Turning to look behind over left and right shoulders while standing    11. Turn 360 degrees    12. Place alternate foot on step or stool while standing unsupported    13. Standing unsupported one foot in front    14. Standing on one leg     Total Score:    Total Score:     Functional gait assessment:  FUNCTIONAL GAIT ASSESSMENT   Date:    GAIT LEVEL  SURFACE Instructions: Walk at your normal speed from here to the next mark (6 m) [20 ft].   2.   CHANGE IN GAIT SPEED Instructions: Begin walking at your normal pace (for 1.5 m [5 ft]). When I tell you "go," walk as fast as you can (for 1.5 m [5 ft]). When I tell you "slow," walk as slowly as you can (for 1.5 m [5 ft].   3.    GAIT WITH HORIZONTAL HEAD TURNS Instructions: Walk from here to the next mark 6 m (20 ft) away. Begin walking at your normal pace. Keep walking straight; after 3 steps, turn your head to the right and keep walking straight while looking to the right. After 3 more steps, turn your head to the left and keep walking straight while looking left. Continue alternating looking right and left.   4.   GAIT WITH VERTICAL HEAD TURNS Instructions: Walk from here to the next mark (6 m [20 ft]). Begin walking at your normal pace. Keep walking straight; after 3 steps, tip your head up and keep walking straight while looking up. After 3 more steps, tip your head down, keep walking straight while looking down. Continue  alternating looking up and down every 3 steps until you have completed 2 repetitions in each direction.   5.  GAIT AND PIVOT TURN Instructions: Begin with walking at your normal pace. When I tell you, "turn and stop," turn as quickly as you can to face the opposite direction and stop.   6.   STEP OVER OBSTACLE Instructions: Begin walking at your normal speed. When you come to the shoe box, step over it, not around it, and keep walking.   7.   GAIT WITH NARROW BASE OF SUPPORT Instructions: Walk on the floor with arms folded across the chest, feet aligned heel to toe in tandem for a distance of 3.6 m [12 ft]. The number of steps taken in a straight line are counted for a maximum of 10 steps.   8.   GAIT WITH EYES CLOSED Instructions: Walk at your normal speed from here to the next mark (6 m [20 ft]) with your eyes closed.   9.   AMBULATING BACKWARDS Instructions: Walk backwards  until I tell you to stop   10. STEPS Instructions: Walk up these stairs as you would at home (ie, using the rail if necessary). At the top turn around and walk down.   Total    Interpretation of scores: Non-Specific Older Adults Cutoff Score: <=22/30 = risk of falls Parkinson's Disease Cutoff score <15/30= fall risk (Hoehn & Yahr 1-4)  Minimally Clinically Important Difference (MCID)  Stroke (acute, subacute, and chronic) = MDC: 4.2 points Vestibular (acute) = MDC: 6 points Community Dwelling Older Adults =  MCID: 4  points Parkinson's Disease  =  MDC: 4.3 points  (Academy of Neurologic Physical Therapy (nd). Functional Gait Assessment. Retrieved from https://www.neuropt.org/docs/default-source/cpgs/core-outcome-measures/function-gait-assessment-pocket-guide-proof9-(2).pdf?sfvrsn=b62f35043_0.)  PATIENT SURVEYS:  ABC scale: The Activities-Specific Balance Confidence (ABC) Scale 0% 10 20 30  40 50 60 70 80 90 100% No confidence<->completely confident  "How confident are you that you will not lose your balance or become unsteady when you . . .   Date tested   Walk around the house   2. Walk up or down stairs   3. Bend over and pick up a slipper from in front of a closet floor   4. Reach for a small can off a shelf at eye level   5. Stand on tip toes and reach for something above your head   6. Stand on a chair and reach for something   7. Sweep the floor   8. Walk outside the house to a car parked in the driveway   9. Get into or out of a car   10. Walk across a parking lot to the mall   11. Walk up or down a ramp   12. Walk in a crowded mall where people rapidly walk past you   13. Are bumped into by people as you walk through the mall   14. Step onto or off of an escalator while you are holding onto the railing   15. Step onto or off an escalator while holding onto parcels such that you cannot hold onto the railing   16. Walk outside on icy sidewalks   Total: #/16                                                                                                                                   TREATMENT DATE: 05/26/2024   The Activities-Specific Balance Confidence (ABC) Scale 0% 10 20 30  40 50 60 70 80 90 100% No confidence<->completely confident  "How confident are you that you will not lose your balance or become unsteady when you . . .   Date tested 05/23/2024  Walk around the house 80%  2. Walk up or down stairs 60%  3. Bend over and pick up a slipper from in front of a closet floor 30%  4. Reach for a small can off a shelf at eye level 100%  5. Stand on tip toes and reach for something above your head 20%  6. Stand on a chair and reach for something 0%  7. Sweep the floor 20%  8. Walk outside the house to a car parked in the driveway 80%  9. Get into or out of a car 40%  10. Walk across a parking lot to the mall 10%  11. Walk up or down a ramp 40%  12. Walk in a crowded mall where people rapidly walk past you 50%  13. Are bumped into by people as you walk through the mall 10%  14.  Step onto or off of an escalator while you are holding onto the railing 30%  15. Step onto or off an escalator while holding onto parcels such that you cannot hold onto the railing 0%  16. Walk outside on icy sidewalks 0%  Total: #/16 35.6     BERG (See flowsheet) - 42/56  FGA= 7/30      PATIENT EDUCATION: Education details: Pt educated on possibility of benefit on OT for L UE and hand therapy, educated on obtaining referral from MD for OT, interpretation of outcome measures Person educated: Patient and Spouse Education method: Explanation Education comprehension: verbalized understanding  HOME EXERCISE PROGRAM: Provide next session.  GOALS: Goals reviewed with patient? No  SHORT TERM GOALS: Target date: 06/17/2024  Patient will be independent with home exercise program to improve strength/mobility for increased functional independence with ADLs and  mobility. Baseline: Goal status: INITIAL  SHORT TERM GOALS: Target date: 06/17/2024    Patient will be independent in home exercise program to improve strength/mobility for better functional independence with ADLs. Baseline: Goal status: INITIAL   LONG TERM GOALS: Target date: 08/12/2024    Patient will increase ABC scale score >80% to demonstrate better functional mobility and better confidence with ADLs.  Baseline:  Goal status: INITIAL  2.  Patient (> 59 years old) will complete five times sit to stand test in < 15 seconds indicating an increased LE strength and improved balance. Baseline: 16.62s Goal status: INITIAL  3.  Patient will increase Berg Balance score by > 6 points to demonstrate decreased fall risk during functional activities Baseline: 11/14= 42/56 Goal status: INITIAL  4.  Patient will increase 10 meter walk test to >1.82m/s as to improve gait speed for better community ambulation and to reduce fall risk. Baseline: 13.89 (0.417m/s), 13.46 (0.450m/s) AVG: 0.449m/s Goal status: INITIAL  5.  Patient will reduce timed up and go to <11 seconds to reduce fall risk and demonstrate improved transfer/gait ability. Baseline: 21.16s without cane, 25.65s with cane Goal status: INITIAL  6.  Patient will increase FGA by > 7 points as to demonstrate reduced fall risk and improved dynamic gait balance for better safety with community/home ambulation.   Baseline: 11/14= 7/30 Goal status: REVISED    ASSESSMENT:  CLINICAL IMPRESSION: Patient is a 89 y.o. male who was seen today for physical therapy  treatment for difficulty with balance and left arm weakness. Further assessment confirms balance impairment consisting of BERG and FGA scores. He struggled more with dynamic balance and will benefit from balance training to improve his overall balance and decrease is risk of falling.  Patient will benefit from continued skilled physical therapy to address coordination and balance  deficits to improve quality of life.  OBJECTIVE IMPAIRMENTS: Abnormal gait, decreased activity tolerance, decreased balance, decreased coordination, decreased endurance, decreased mobility, difficulty walking, decreased ROM, decreased safety awareness, dizziness, hypomobility, increased fascial restrictions, impaired perceived functional ability, increased muscle spasms, impaired flexibility, impaired sensation, improper body mechanics, and postural dysfunction.   ACTIVITY LIMITATIONS: carrying, lifting, bending, squatting, stairs, transfers, bathing, toileting, dressing, hygiene/grooming, locomotion level, and caring for others  PARTICIPATION LIMITATIONS: cleaning, laundry, driving, shopping, and yard work  PERSONAL FACTORS: Age, Education, Time since onset of injury/illness/exacerbation, and 3+ comorbidities: stroke, HTN, BPH are also affecting patient's functional outcome.   REHAB POTENTIAL: Good  CLINICAL DECISION MAKING: Evolving/moderate complexity  EVALUATION COMPLEXITY: Moderate  PLAN:  PT FREQUENCY: 2x/week  PT DURATION: 12 weeks  PLANNED INTERVENTIONS: 97164- PT Re-evaluation, 97750- Physical Performance Testing,  97110-Therapeutic exercises, 97530- Therapeutic activity, [id number]- Neuromuscular re-education, [phone]- Self Care, [id number]- Manual therapy, [phone]- Gait training, [phone]- Aquatic Therapy, [phone]- Electrical stimulation (unattended), [phone] (1-2 muscles), [id number] (3+ muscles)- Dry Needling, Patient/Family education, Balance training, Stair training, Joint mobilization, Joint manipulation, Spinal mobilization, Vestibular training, Visual/preceptual remediation/compensation, DME instructions, Wheelchair mobility training, Cryotherapy, and Moist heat  PLAN FOR NEXT SESSION:  -Provide HEP and instruction -begin balance/ambulation/coordination activities  Chyrl London, PT Physical Therapist - [facility]  [facility]  9:29 AM 05/26/24

## 2024-05-23 ENCOUNTER — Ambulatory Visit

## 2024-05-23 DIAGNOSIS — M6281 Muscle weakness (generalized): Secondary | ICD-10-CM

## 2024-05-23 DIAGNOSIS — R278 Other lack of coordination: Secondary | ICD-10-CM

## 2024-05-23 DIAGNOSIS — R2689 Other abnormalities of gait and mobility: Secondary | ICD-10-CM

## 2024-05-23 DIAGNOSIS — R2681 Unsteadiness on feet: Secondary | ICD-10-CM

## 2024-05-23 DIAGNOSIS — R262 Difficulty in walking, not elsewhere classified: Secondary | ICD-10-CM

## 2024-05-27 ENCOUNTER — Ambulatory Visit

## 2024-05-27 DIAGNOSIS — M6281 Muscle weakness (generalized): Secondary | ICD-10-CM

## 2024-05-27 DIAGNOSIS — R2689 Other abnormalities of gait and mobility: Secondary | ICD-10-CM

## 2024-05-27 DIAGNOSIS — R278 Other lack of coordination: Secondary | ICD-10-CM

## 2024-05-27 DIAGNOSIS — R2681 Unsteadiness on feet: Secondary | ICD-10-CM

## 2024-05-27 DIAGNOSIS — R262 Difficulty in walking, not elsewhere classified: Secondary | ICD-10-CM

## 2024-05-27 NOTE — Therapy (Signed)
 OUTPATIENT PHYSICAL THERAPY NEURO TREATMENT   Patient Name: James Herrera. MRN: [id number] DOB:[birth year], 89 y.o., male Today's Date: 05/28/2024   PCP: Sadie Manna, MD  REFERRING PROVIDER: Lane Arthea FORBES, MD  END OF SESSION:  PT End of Session - 05/27/24 1156     Visit Number 3    Number of Visits 24    Date for Recertification  08/12/24    Progress Note Due on Visit 10    PT Start Time 1157    PT Stop Time 1237    PT Time Calculation (min) 40 min    Equipment Utilized During Treatment Gait belt    Activity Tolerance Patient tolerated treatment well    Behavior During Therapy WFL for tasks assessed/performed           Past Medical History:  Diagnosis Date   BPH (benign prostatic hyperplasia)    Difficulty balancing    History of kidney stones    History of stroke    Hypertension    Left arm weakness    Neuropathy    Sensory ataxia    Past Surgical History:  Procedure Laterality Date   CATARACT EXTRACTION W/PHACO Right 12/31/2023   Procedure: PHACOEMULSIFICATION, CATARACT, WITH IOL  11.29 01:03.5;  Surgeon: Myrna Adine Anes, MD;  Location: [facility];  Service: Ophthalmology;  Laterality: Right;   CATARACT EXTRACTION W/PHACO Left 02/04/2024   Procedure: PHACOEMULSIFICATION, CATARACT, WITH IOL INSERTION 7.28, 00:43.6;  Surgeon: Myrna Adine Anes, MD;  Location: [facility];  Service: Ophthalmology;  Laterality: Left;   HERNIA REPAIR Right    HERNIA REPAIR Left    HERNIA REPAIR     umbilical   kidney stone removal      x2   PROSTATE BIOPSY N/A 04/22/2020   Procedure: PROSTATE BIOPSY GRAYCE;  Surgeon: Kassie Ozell SAUNDERS, MD;  Location: [facility];  Service: Urology;  Laterality: N/A;   TONSILLECTOMY     There are no active problems to display for this patient.   ONSET DATE: >6 months  REFERRING DIAG: Per recent progress note: Leg weakness and imbalance  THERAPY DIAG:  Other abnormalities of gait and mobility  Other  lack of coordination  Difficulty in walking, not elsewhere classified  Muscle weakness (generalized)  Unsteadiness on feet  Rationale for Evaluation and Treatment: Rehabilitation  SUBJECTIVE:                                                                                                                                                                                             SUBJECTIVE STATEMENT: From Today: Patient states doing well without any new concerns.  Denies any pain or falls.   From EVAL: Pt reports wife suggested he return to therapy. Pt reports after last bout of therapy, he had reached his limit of improvement, but decided he wanted to try again after his balance has declined since then. Pt reports he has trouble holding things in his hand, and that grasping is difficult. Pt reports his L arm and hand have been the biggest problem for him. Pt reports he notices that when he crosses a line on the floor he has visual issues where he has to be careful to prevent LOB. Pt reports 5  falls within the last 2 years due to carelessness and dizziness. Reports his tendency when falling is backwards. Reports issues with dressing and putting pants on, and that he uses pull on pants.  Pt accompanied by: wife  PERTINENT HISTORY: Per recent progress note by Sherran Milon Berliner, PA: LEFT ARM WEAKNESS/ BALANCE DIFFICULTY/ HISTORY OF STROKE / SENSORY ATAXIA/ BACK PAIN   Ongoing  Patient with past medical history significant for stroke presents for follow-up accordingly, also follow-up of neuropathy and tremor.  Since last office visit he has self tapered off of gabapentin to 400 mg nightly.  His primary concern today is ongoing bilateral hand tremor, worse in his left and with action or increased stress.  He also notes imbalance.  Referral to physical therapy for leg weakness and imbalance  PAIN:  Are you having pain? Yes: NPRS scale: 4-5/10 Pain location: L hand and UE Pain  description: tight, aching Aggravating factors: none Relieving factors: sometimes 1 aspirin and NSAIDs  PRECAUTIONS: Fall  RED FLAGS: None   WEIGHT BEARING RESTRICTIONS: No  FALLS: Has patient fallen in last 6 months? Yes. Number of falls 5 and within last 2 years, noting mostly in the backwards direction, or when stepping over lines.  LIVING ENVIRONMENT: Lives with: lives with their spouse Lives in: House/apartment Stairs: 2+2 steps, railing and grab bars on both sides Has following equipment at home: Single point cane and Quad cane small base  PLOF: Requires assistive device for independence, Needs assistance with ADLs, Needs assistance with homemaking, Needs assistance with gait, Needs assistance with transfers, and wife reports assisting him with most tasks, but he could do them himself if he needed to  PATIENT GOALS: Improve balance, work on improving the L UE and hand  OBJECTIVE:  Note: Objective measures were completed at Evaluation unless otherwise noted.  DIAGNOSTIC FINDINGS:  MRI 01/2022: IMPRESSION: 1. No specific or reversible cause for symptoms. 2. Brain atrophy and chronic small vessel ischemia without notable progression from 2022.    MRI 05/2021: IMPRESSION: No evidence of acute intracranial abnormality.   Small chronic cortical infarct within the high posterior left frontal lobe.   Small chronic lacunar infarcts within the bilateral basal ganglia.   Mild chronic small vessel ischemic changes within the cerebral white matter and pons.   Mild-to-moderate generalized cerebral atrophy. Comparatively mild cerebellar atrophy.   Minimal bilateral ethmoid and right maxillary sinus mucosal thickening.    COGNITION: Overall cognitive status: Within functional limits for tasks assessed   SENSATION: Light touch: Impaired  and L upper thigh pt reports no sensation   COORDINATION: RAMPs: L side impaired, jerky and slower compared to R side H<>Shin: WNL  BLE F<>N: impaired L side, extended time taken to initially raise arm and find finger and chin  POSTURE: rounded shoulders, forward head, and increased thoracic kyphosis  LOWER EXTREMITY ROM:     Active  Right  Eval Left Eval  Hip flexion [facility]  Hip extension    Hip abduction    Hip adduction    Hip internal rotation    Hip external rotation    Knee flexion [facility] WFL  Knee extension [facility]  Ankle dorsiflexion [facility]  Ankle plantarflexion    Ankle inversion    Ankle eversion     (Blank rows = not tested)  LOWER EXTREMITY MMT:    MMT Right Eval Left Eval  Hip flexion 5 5  Hip extension    Hip abduction    Hip adduction    Hip internal rotation    Hip external rotation    Knee flexion 5 5  Knee extension 5 5  Ankle dorsiflexion 4+ 4+  Ankle plantarflexion    Ankle inversion    Ankle eversion    (Blank rows = not tested)  UPPER EXTREMITY MMT:  MMT Right eval Left eval  Shoulder flexion 5 4  Shoulder extension    Shoulder abduction    Shoulder adduction    Shoulder extension    Shoulder internal rotation    Shoulder external rotation    Elbow flexion 5 4+  Elbow extension 5 4+  Wrist flexion    Wrist extension    Wrist ulnar deviation    Wrist radial deviation    Wrist pronation    Wrist supination     (Blank rows = not tested)   BED MOBILITY:  Not tested  TRANSFERS: Sit to stand: CGA and Min A  Assistive device utilized: Corporate treasurer base and arm rest     Stand to sit: CGA  Assistive device utilized: Counselling psychologist and arm rest     Chair to chair: CGA  Assistive device utilized: Counselling psychologist and arm rest       STAIRS: Not tested GAIT: Findings: Gait Characteristics: decreased arm swing- Right, decreased arm swing- Left, decreased step length- Right, decreased step length- Left, decreased stride length, decreased hip/knee flexion- Right, and decreased hip/knee flexion- Left, Distance walked: 40m, Assistive device utilized:Quad  cane small base and None, Level of assistance: CGA and Min A, and Comments: Shuffling pattern upon gait initiation, pt reports freezing of gait when steeping over lines and changed surfaces, not visualized during this assessment   FUNCTIONAL TESTS:  -Pt performed 5 time sit<>stand (5xSTS): 16.62 sec (>15 sec indicates increased fall risk)  -10 Meter Walk Test: Patient instructed to walk 10 meters (32.8 ft) as quickly and as safely as possible at their normal speed x2 and at a fast speed x2. Time measured from 2 meter mark to 8 meter mark to accommodate ramp-up and ramp-down.  Normal speed 1: 0.432 m/s Normal speed 2: 0.446 m/s Average Normal speed: 0.439 m/s Cut off scores: <0.4 m/s = household Ambulator, 0.4-0.8 m/s = limited community Ambulator, >0.8 m/s = community Ambulator, >1.2 m/s = crossing a street, <1.0 = increased fall risk MCID 0.05 m/s (small), 0.13 m/s (moderate), 0.06 m/s (significant)  (ANPTA Core Set of Outcome Measures for Adults with Neurologic Conditions, 2018)   -PT instructed pt in TUG: 21.16s without cane, 25.65s with cane sec (average of 3 trials; >13.5 sec indicates increased fall risk)   PATIENT SURVEYS:  The Activities-Specific Balance Confidence (ABC) Scale 0% 10 20 30  40 50 60 70 80 90 100% No confidence<->completely confident  "How confident are you that you will not lose your balance or become unsteady when you . SABRA SABRA  Date tested 05/23/2024  Walk around the house 80%  2. Walk up or down stairs 60%  3. Bend over and pick up a slipper from in front of a closet floor 30%  4. Reach for a small can off a shelf at eye level 100%  5. Stand on tip toes and reach for something above your head 20%  6. Stand on a chair and reach for something 0%  7. Sweep the floor 20%  8. Walk outside the house to a car parked in the driveway 80%  9. Get into or out of a car 40%  10. Walk across a parking lot to the mall 10%  11. Walk up or down a ramp 40%  12. Walk in a  crowded mall where people rapidly walk past you 50%  13. Are bumped into by people as you walk through the mall 10%  14. Step onto or off of an escalator while you are holding onto the railing 30%  15. Step onto or off an escalator while holding onto parcels such that you cannot hold onto the railing 0%  16. Walk outside on icy sidewalks 0%  Total: #/16 35.6                                                                                                                                   TREATMENT DATE: 05/27/2024    NMR:  Dynamic high knee march with min to no UE support x 25 reps alt LE Static stand with eyes open and feet apart on firm surface x 30 sec x 2 Static stand with eyes closed and feet apart on firm surface x 30 sec x 2 Static stand with horizontal head motions x 10  on airex pad Static stand with Vertical head motions x 10 on airex pad Static stand with eyes open and feet together on airex pad x 30 sec x 2 Static stand with eyes closed and feet together on airex pad x 30 sec x 2 (very unsteady- posterior and anterior lean)    TA:  Step up 4 step with 1UE support x 12 reps alt LE Side stepping (resistive) RTB- at support bar with 1 UE support x approx 5 feet - down and back x 5 Sit to stand (Increased VC and tactile cues for technique)  x 12 reps  with 1 UE support.      PATIENT EDUCATION: Education details: Pt educated on possibility of benefit on OT for L UE and hand therapy, educated on obtaining referral from MD for OT, interpretation of outcome measures Person educated: Patient and Spouse Education method: Explanation Education comprehension: verbalized understanding  HOME EXERCISE PROGRAM: Provide next session.  GOALS: Goals reviewed with patient? No  SHORT TERM GOALS: Target date: 06/17/2024  Patient will be independent with home exercise program to improve strength/mobility for increased functional independence with ADLs and mobility. Baseline: Goal  status: INITIAL  SHORT  TERM GOALS: Target date: 06/17/2024    Patient will be independent in home exercise program to improve strength/mobility for better functional independence with ADLs. Baseline: Goal status: INITIAL   LONG TERM GOALS: Target date: 08/12/2024    Patient will increase ABC scale score >80% to demonstrate better functional mobility and better confidence with ADLs.  Baseline:  Goal status: INITIAL  2.  Patient (> 56 years old) will complete five times sit to stand test in < 15 seconds indicating an increased LE strength and improved balance. Baseline: 16.62s Goal status: INITIAL  3.  Patient will increase Berg Balance score by > 6 points to demonstrate decreased fall risk during functional activities Baseline: 11/14= 42/56 Goal status: INITIAL  4.  Patient will increase 10 meter walk test to >1.11m/s as to improve gait speed for better community ambulation and to reduce fall risk. Baseline: 13.89 (0.4106m/s), 13.46 (0.437m/s) AVG: 0.442m/s Goal status: INITIAL  5.  Patient will reduce timed up and go to <11 seconds to reduce fall risk and demonstrate improved transfer/gait ability. Baseline: 21.16s without cane, 25.65s with cane Goal status: INITIAL  6.  Patient will increase FGA by > 7 points as to demonstrate reduced fall risk and improved dynamic gait balance for better safety with community/home ambulation.   Baseline: 11/14= 7/30 Goal status: REVISED    ASSESSMENT:  CLINICAL IMPRESSION: Patient is a 89 y.o. male who was seen today for physical therapy  treatment for difficulty with balance and left arm weakness. Treatment focused on initiating some basic balance interventions targeting areas of weakness seen in test results from last visit. He was very challenged today with all activities with eyes closed and close CGA provided today. He was very fatigued at end of session stating his legs were very tired. Will continue to try to build up his leg strength in  future visits as well.  Patient will benefit from continued skilled physical therapy to address coordination and balance deficits to improve quality of life.  OBJECTIVE IMPAIRMENTS: Abnormal gait, decreased activity tolerance, decreased balance, decreased coordination, decreased endurance, decreased mobility, difficulty walking, decreased ROM, decreased safety awareness, dizziness, hypomobility, increased fascial restrictions, impaired perceived functional ability, increased muscle spasms, impaired flexibility, impaired sensation, improper body mechanics, and postural dysfunction.   ACTIVITY LIMITATIONS: carrying, lifting, bending, squatting, stairs, transfers, bathing, toileting, dressing, hygiene/grooming, locomotion level, and caring for others  PARTICIPATION LIMITATIONS: cleaning, laundry, driving, shopping, and yard work  PERSONAL FACTORS: Age, Education, Time since onset of injury/illness/exacerbation, and 3+ comorbidities: stroke, HTN, BPH are also affecting patient's functional outcome.   REHAB POTENTIAL: Good  CLINICAL DECISION MAKING: Evolving/moderate complexity  EVALUATION COMPLEXITY: Moderate  PLAN:  PT FREQUENCY: 2x/week  PT DURATION: 12 weeks  PLANNED INTERVENTIONS: 97164- PT Re-evaluation, 97750- Physical Performance Testing, 97110-Therapeutic exercises, 97530- Therapeutic activity, 97112- Neuromuscular re-education, 97535- Self Care, [id number]- Manual therapy, [phone]- Gait training, [phone]- Aquatic Therapy, [phone]- Electrical stimulation (unattended), 20560 (1-2 muscles), 20561 (3+ muscles)- Dry Needling, Patient/Family education, Balance training, Stair training, Joint mobilization, Joint manipulation, Spinal mobilization, Vestibular training, Visual/preceptual remediation/compensation, DME instructions, Wheelchair mobility training, Cryotherapy, and Moist heat  PLAN FOR NEXT SESSION:  -Provide HEP and instruction -begin balance/ambulation/coordination activities  Chyrl London, PT Physical Therapist - [facility]  [facility]  7:20 AM 05/28/24

## 2024-05-29 ENCOUNTER — Ambulatory Visit

## 2024-05-30 ENCOUNTER — Ambulatory Visit

## 2024-06-02 ENCOUNTER — Ambulatory Visit: Admitting: Occupational Therapy

## 2024-06-02 ENCOUNTER — Ambulatory Visit: Admitting: Physical Therapy

## 2024-06-02 NOTE — Therapy (Deleted)
 OUTPATIENT PHYSICAL THERAPY NEURO TREATMENT   Patient Name: James Herrera. MRN: [id number] DOB:1933-11-15, 89 y.o., male Today's Date: 06/02/2024   PCP: Sadie Manna, MD  REFERRING PROVIDER: Lane Arthea FORBES, MD  END OF SESSION:     Past Medical History:  Diagnosis Date   BPH (benign prostatic hyperplasia)    Difficulty balancing    History of kidney stones    History of stroke    Hypertension    Left arm weakness    Neuropathy    Sensory ataxia    Past Surgical History:  Procedure Laterality Date   CATARACT EXTRACTION W/PHACO Right 12/31/2023   Procedure: PHACOEMULSIFICATION, CATARACT, WITH IOL  11.29 01:03.5;  Surgeon: Myrna Adine Anes, MD;  Location: [facility];  Service: Ophthalmology;  Laterality: Right;   CATARACT EXTRACTION W/PHACO Left 02/04/2024   Procedure: PHACOEMULSIFICATION, CATARACT, WITH IOL INSERTION 7.28, 00:43.6;  Surgeon: Myrna Adine Anes, MD;  Location: [facility];  Service: Ophthalmology;  Laterality: Left;   HERNIA REPAIR Right    HERNIA REPAIR Left    HERNIA REPAIR     umbilical   kidney stone removal      x2   PROSTATE BIOPSY N/A 04/22/2020   Procedure: PROSTATE BIOPSY GRAYCE;  Surgeon: Kassie Ozell SAUNDERS, MD;  Location: [facility];  Service: Urology;  Laterality: N/A;   TONSILLECTOMY     There are no active problems to display for this patient.   ONSET DATE: >6 months  REFERRING DIAG: Per recent progress note: Leg weakness and imbalance  THERAPY DIAG:  Other abnormalities of gait and mobility  Difficulty in walking, not elsewhere classified  Muscle weakness (generalized)  Unsteadiness on feet  Rationale for Evaluation and Treatment: Rehabilitation  SUBJECTIVE:                                                                                                                                                                                             SUBJECTIVE STATEMENT: From Today: Patient states  doing well without any new concerns. Denies any pain or falls.   From EVAL: Pt reports wife suggested he return to therapy. Pt reports after last bout of therapy, he had reached his limit of improvement, but decided he wanted to try again after his balance has declined since then. Pt reports he has trouble holding things in his hand, and that grasping is difficult. Pt reports his L arm and hand have been the biggest problem for him. Pt reports he notices that when he crosses a line on the floor he has visual issues where he has to be careful to prevent LOB. Pt  reports 5  falls within the last 2 years due to carelessness and dizziness. Reports his tendency when falling is backwards. Reports issues with dressing and putting pants on, and that he uses pull on pants.  Pt accompanied by: wife  PERTINENT HISTORY: Per recent progress note by Sherran Milon Berliner, PA: LEFT ARM WEAKNESS/ BALANCE DIFFICULTY/ HISTORY OF STROKE / SENSORY ATAXIA/ BACK PAIN   Ongoing  Patient with past medical history significant for stroke presents for follow-up accordingly, also follow-up of neuropathy and tremor.  Since last office visit he has self tapered off of gabapentin to 400 mg nightly.  His primary concern today is ongoing bilateral hand tremor, worse in his left and with action or increased stress.  He also notes imbalance.  Referral to physical therapy for leg weakness and imbalance  PAIN:  Are you having pain? Yes: NPRS scale: 4-5/10 Pain location: L hand and UE Pain description: tight, aching Aggravating factors: none Relieving factors: sometimes 1 aspirin and NSAIDs  PRECAUTIONS: Fall  RED FLAGS: None   WEIGHT BEARING RESTRICTIONS: No  FALLS: Has patient fallen in last 6 months? Yes. Number of falls 5 and within last 2 years, noting mostly in the backwards direction, or when stepping over lines.  LIVING ENVIRONMENT: Lives with: lives with their spouse Lives in: House/apartment Stairs: 2+2  steps, railing and grab bars on both sides Has following equipment at home: Single point cane and Quad cane small base  PLOF: Requires assistive device for independence, Needs assistance with ADLs, Needs assistance with homemaking, Needs assistance with gait, Needs assistance with transfers, and wife reports assisting him with most tasks, but he could do them himself if he needed to  PATIENT GOALS: Improve balance, work on improving the L UE and hand  OBJECTIVE:  Note: Objective measures were completed at Evaluation unless otherwise noted.  DIAGNOSTIC FINDINGS:  MRI 01/2022: IMPRESSION: 1. No specific or reversible cause for symptoms. 2. Brain atrophy and chronic small vessel ischemia without notable progression from 2022.    MRI 05/2021: IMPRESSION: No evidence of acute intracranial abnormality.   Small chronic cortical infarct within the high posterior left frontal lobe.   Small chronic lacunar infarcts within the bilateral basal ganglia.   Mild chronic small vessel ischemic changes within the cerebral white matter and pons.   Mild-to-moderate generalized cerebral atrophy. Comparatively mild cerebellar atrophy.   Minimal bilateral ethmoid and right maxillary sinus mucosal thickening.    COGNITION: Overall cognitive status: Within functional limits for tasks assessed   SENSATION: Light touch: Impaired  and L upper thigh pt reports no sensation   COORDINATION: RAMPs: L side impaired, jerky and slower compared to R side H<>Shin: WNL BLE F<>N: impaired L side, extended time taken to initially raise arm and find finger and chin  POSTURE: rounded shoulders, forward head, and increased thoracic kyphosis  LOWER EXTREMITY ROM:     Active  Right Eval Left Eval  Hip flexion [facility]  Hip extension    Hip abduction    Hip adduction    Hip internal rotation    Hip external rotation    Knee flexion [facility]  Knee extension [facility]  Ankle dorsiflexion [facility]  Ankle  plantarflexion    Ankle inversion    Ankle eversion     (Blank rows = not tested)  LOWER EXTREMITY MMT:    MMT Right Eval Left Eval  Hip flexion 5 5  Hip extension    Hip abduction    Hip  adduction    Hip internal rotation    Hip external rotation    Knee flexion 5 5  Knee extension 5 5  Ankle dorsiflexion 4+ 4+  Ankle plantarflexion    Ankle inversion    Ankle eversion    (Blank rows = not tested)  UPPER EXTREMITY MMT:  MMT Right eval Left eval  Shoulder flexion 5 4  Shoulder extension    Shoulder abduction    Shoulder adduction    Shoulder extension    Shoulder internal rotation    Shoulder external rotation    Elbow flexion 5 4+  Elbow extension 5 4+  Wrist flexion    Wrist extension    Wrist ulnar deviation    Wrist radial deviation    Wrist pronation    Wrist supination     (Blank rows = not tested)   BED MOBILITY:  Not tested  TRANSFERS: Sit to stand: CGA and Min A  Assistive device utilized: Corporate treasurer base and arm rest     Stand to sit: CGA  Assistive device utilized: Counselling psychologist and arm rest     Chair to chair: CGA  Assistive device utilized: Counselling psychologist and arm rest       STAIRS: Not tested GAIT: Findings: Gait Characteristics: decreased arm swing- Right, decreased arm swing- Left, decreased step length- Right, decreased step length- Left, decreased stride length, decreased hip/knee flexion- Right, and decreased hip/knee flexion- Left, Distance walked: 71m, Assistive device utilized:Quad cane small base and None, Level of assistance: CGA and Min A, and Comments: Shuffling pattern upon gait initiation, pt reports freezing of gait when steeping over lines and changed surfaces, not visualized during this assessment   FUNCTIONAL TESTS:  -Pt performed 5 time sit<>stand (5xSTS): 16.62 sec (>15 sec indicates increased fall risk)  -10 Meter Walk Test: Patient instructed to walk 10 meters (32.8 ft) as quickly and as safely as  possible at their normal speed x2 and at a fast speed x2. Time measured from 2 meter mark to 8 meter mark to accommodate ramp-up and ramp-down.  Normal speed 1: 0.432 m/s Normal speed 2: 0.446 m/s Average Normal speed: 0.439 m/s Cut off scores: <0.4 m/s = household Ambulator, 0.4-0.8 m/s = limited community Ambulator, >0.8 m/s = community Ambulator, >1.2 m/s = crossing a street, <1.0 = increased fall risk MCID 0.05 m/s (small), 0.13 m/s (moderate), 0.06 m/s (significant)  (ANPTA Core Set of Outcome Measures for Adults with Neurologic Conditions, 2018)   -PT instructed pt in TUG: 21.16s without cane, 25.65s with cane sec (average of 3 trials; >13.5 sec indicates increased fall risk)   PATIENT SURVEYS:  The Activities-Specific Balance Confidence (ABC) Scale 0% 10 20 30  40 50 60 70 80 90 100% No confidence<->completely confident  "How confident are you that you will not lose your balance or become unsteady when you . . .   Date tested 05/23/2024  Walk around the house 80%  2. Walk up or down stairs 60%  3. Bend over and pick up a slipper from in front of a closet floor 30%  4. Reach for a small can off a shelf at eye level 100%  5. Stand on tip toes and reach for something above your head 20%  6. Stand on a chair and reach for something 0%  7. Sweep the floor 20%  8. Walk outside the house to a car parked in the driveway 80%  9. Get into or out  of a car 40%  10. Walk across a parking lot to the mall 10%  11. Walk up or down a ramp 40%  12. Walk in a crowded mall where people rapidly walk past you 50%  13. Are bumped into by people as you walk through the mall 10%  14. Step onto or off of an escalator while you are holding onto the railing 30%  15. Step onto or off an escalator while holding onto parcels such that you cannot hold onto the railing 0%  16. Walk outside on icy sidewalks 0%  Total: #/16 35.6                                                                                                                                    TREATMENT DATE: 05/27/2024    NMR:  Dynamic high knee march with min to no UE support x 25 reps alt LE Static stand with eyes open and feet apart on firm surface x 30 sec x 2 Static stand with eyes closed and feet apart on firm surface x 30 sec x 2 Static stand with horizontal head motions x 10  on airex pad Static stand with Vertical head motions x 10 on airex pad Static stand with eyes open and feet together on airex pad x 30 sec x 2 Static stand with eyes closed and feet together on airex pad x 30 sec x 2 (very unsteady- posterior and anterior lean)    TA:  Step up 4 step with 1UE support x 12 reps alt LE Side stepping (resistive) RTB- at support bar with 1 UE support x approx 5 feet - down and back x 5 Sit to stand (Increased VC and tactile cues for technique)  x 12 reps  with 1 UE support.      PATIENT EDUCATION: Education details: Pt educated on possibility of benefit on OT for L UE and hand therapy, educated on obtaining referral from MD for OT, interpretation of outcome measures Person educated: Patient and Spouse Education method: Explanation Education comprehension: verbalized understanding  HOME EXERCISE PROGRAM: Provide next session.  GOALS: Goals reviewed with patient? No  SHORT TERM GOALS: Target date: 06/17/2024  Patient will be independent with home exercise program to improve strength/mobility for increased functional independence with ADLs and mobility. Baseline: Goal status: INITIAL  SHORT TERM GOALS: Target date: 06/17/2024    Patient will be independent in home exercise program to improve strength/mobility for better functional independence with ADLs. Baseline: Goal status: INITIAL   LONG TERM GOALS: Target date: 08/12/2024    Patient will increase ABC scale score >80% to demonstrate better functional mobility and better confidence with ADLs.  Baseline:  Goal status: INITIAL  2.  Patient (> 51  years old) will complete five times sit to stand test in < 15 seconds indicating an increased LE strength and improved balance. Baseline: 16.62s Goal status: INITIAL  3.  Patient will increase Berg Balance score by > 6 points to demonstrate decreased fall risk during functional activities Baseline: 11/14= 42/56 Goal status: INITIAL  4.  Patient will increase 10 meter walk test to >1.89m/s as to improve gait speed for better community ambulation and to reduce fall risk. Baseline: 13.89 (0.445m/s), 13.46 (0.4104m/s) AVG: 0.485m/s Goal status: INITIAL  5.  Patient will reduce timed up and go to <11 seconds to reduce fall risk and demonstrate improved transfer/gait ability. Baseline: 21.16s without cane, 25.65s with cane Goal status: INITIAL  6.  Patient will increase FGA by > 7 points as to demonstrate reduced fall risk and improved dynamic gait balance for better safety with community/home ambulation.   Baseline: 11/14= 7/30 Goal status: REVISED    ASSESSMENT:  CLINICAL IMPRESSION: Patient is a 89 y.o. male who was seen today for physical therapy  treatment for difficulty with balance and left arm weakness. Treatment focused on initiating some basic balance interventions targeting areas of weakness seen in test results from last visit. He was very challenged today with all activities with eyes closed and close CGA provided today. He was very fatigued at end of session stating his legs were very tired. Will continue to try to build up his leg strength in future visits as well.  Patient will benefit from continued skilled physical therapy to address coordination and balance deficits to improve quality of life.  OBJECTIVE IMPAIRMENTS: Abnormal gait, decreased activity tolerance, decreased balance, decreased coordination, decreased endurance, decreased mobility, difficulty walking, decreased ROM, decreased safety awareness, dizziness, hypomobility, increased fascial restrictions, impaired  perceived functional ability, increased muscle spasms, impaired flexibility, impaired sensation, improper body mechanics, and postural dysfunction.   ACTIVITY LIMITATIONS: carrying, lifting, bending, squatting, stairs, transfers, bathing, toileting, dressing, hygiene/grooming, locomotion level, and caring for others  PARTICIPATION LIMITATIONS: cleaning, laundry, driving, shopping, and yard work  PERSONAL FACTORS: Age, Education, Time since onset of injury/illness/exacerbation, and 3+ comorbidities: stroke, HTN, BPH are also affecting patient's functional outcome.   REHAB POTENTIAL: Good  CLINICAL DECISION MAKING: Evolving/moderate complexity  EVALUATION COMPLEXITY: Moderate  PLAN:  PT FREQUENCY: 2x/week  PT DURATION: 12 weeks  PLANNED INTERVENTIONS: 97164- PT Re-evaluation, 97750- Physical Performance Testing, 97110-Therapeutic exercises, 97530- Therapeutic activity, 97112- Neuromuscular re-education, 97535- Self Care, [id number]- Manual therapy, [phone]- Gait training, [phone]- Aquatic Therapy, [phone]- Electrical stimulation (unattended), [phone] (1-2 muscles), [phone] (3+ muscles)- Dry Needling, Patient/Family education, Balance training, Stair training, Joint mobilization, Joint manipulation, Spinal mobilization, Vestibular training, Visual/preceptual remediation/compensation, DME instructions, Wheelchair mobility training, Cryotherapy, and Moist heat  PLAN FOR NEXT SESSION:  -Provide HEP and instruction -begin balance/ambulation/coordination activities  Note: Portions of this document were prepared using Dragon voice recognition software and although reviewed may contain unintentional dictation errors in syntax, grammar, or spelling.  Lonni KATHEE Gainer PT ,DPT Physical Therapist- [place]  [facility]   9:00 AM 06/02/24

## 2024-06-04 ENCOUNTER — Ambulatory Visit: Admitting: Physical Therapy

## 2024-06-04 DIAGNOSIS — R262 Difficulty in walking, not elsewhere classified: Secondary | ICD-10-CM

## 2024-06-04 DIAGNOSIS — R2681 Unsteadiness on feet: Secondary | ICD-10-CM

## 2024-06-04 DIAGNOSIS — R2689 Other abnormalities of gait and mobility: Secondary | ICD-10-CM

## 2024-06-04 DIAGNOSIS — M6281 Muscle weakness (generalized): Secondary | ICD-10-CM

## 2024-06-04 NOTE — Therapy (Signed)
 OUTPATIENT PHYSICAL THERAPY NEURO TREATMENT   Patient Name: James Herrera. MRN: [id number] DOB:03/27/1934, 89 y.o., male Today's Date: 06/04/2024   PCP: Sadie Manna, MD  REFERRING PROVIDER: Lane Arthea FORBES, MD  END OF SESSION:  PT End of Session - 06/04/24 1026     Visit Number 4    Number of Visits 24    Date for Recertification  08/12/24    Progress Note Due on Visit 10    PT Start Time 0930    PT Stop Time 1012    PT Time Calculation (min) 42 min    Equipment Utilized During Treatment Gait belt    Activity Tolerance Patient tolerated treatment well    Behavior During Therapy WFL for tasks assessed/performed            Past Medical History:  Diagnosis Date   BPH (benign prostatic hyperplasia)    Difficulty balancing    History of kidney stones    History of stroke    Hypertension    Left arm weakness    Neuropathy    Sensory ataxia    Past Surgical History:  Procedure Laterality Date   CATARACT EXTRACTION W/PHACO Right 12/31/2023   Procedure: PHACOEMULSIFICATION, CATARACT, WITH IOL  11.29 01:03.5;  Surgeon: Myrna Adine Anes, MD;  Location: [facility];  Service: Ophthalmology;  Laterality: Right;   CATARACT EXTRACTION W/PHACO Left 02/04/2024   Procedure: PHACOEMULSIFICATION, CATARACT, WITH IOL INSERTION 7.28, 00:43.6;  Surgeon: Myrna Adine Anes, MD;  Location: [facility];  Service: Ophthalmology;  Laterality: Left;   HERNIA REPAIR Right    HERNIA REPAIR Left    HERNIA REPAIR     umbilical   kidney stone removal      x2   PROSTATE BIOPSY N/A 04/22/2020   Procedure: PROSTATE BIOPSY GRAYCE;  Surgeon: Kassie Ozell SAUNDERS, MD;  Location: [facility];  Service: Urology;  Laterality: N/A;   TONSILLECTOMY     There are no active problems to display for this patient.   ONSET DATE: >6 months  REFERRING DIAG: Per recent progress note: Leg weakness and imbalance  THERAPY DIAG:  Other abnormalities of gait and  mobility  Difficulty in walking, not elsewhere classified  Muscle weakness (generalized)  Unsteadiness on feet  Rationale for Evaluation and Treatment: Rehabilitation  SUBJECTIVE:                                                                                                                                                                                             SUBJECTIVE STATEMENT: From Today: Patient states doing well without any new concerns. Denies any pain or  falls.   From EVAL: Pt reports wife suggested he return to therapy. Pt reports after last bout of therapy, he had reached his limit of improvement, but decided he wanted to try again after his balance has declined since then. Pt reports he has trouble holding things in his hand, and that grasping is difficult. Pt reports his L arm and hand have been the biggest problem for him. Pt reports he notices that when he crosses a line on the floor he has visual issues where he has to be careful to prevent LOB. Pt reports 5  falls within the last 2 years due to carelessness and dizziness. Reports his tendency when falling is backwards. Reports issues with dressing and putting pants on, and that he uses pull on pants.  Pt accompanied by: wife  PERTINENT HISTORY: Per recent progress note by Sherran Milon Berliner, PA: LEFT ARM WEAKNESS/ BALANCE DIFFICULTY/ HISTORY OF STROKE / SENSORY ATAXIA/ BACK PAIN   Ongoing  Patient with past medical history significant for stroke presents for follow-up accordingly, also follow-up of neuropathy and tremor.  Since last office visit he has self tapered off of gabapentin to 400 mg nightly.  His primary concern today is ongoing bilateral hand tremor, worse in his left and with action or increased stress.  He also notes imbalance.  Referral to physical therapy for leg weakness and imbalance  PAIN:  Are you having pain? Yes: NPRS scale: 4-5/10 Pain location: L hand and UE Pain description: tight,  aching Aggravating factors: none Relieving factors: sometimes 1 aspirin and NSAIDs  PRECAUTIONS: Fall  RED FLAGS: None   WEIGHT BEARING RESTRICTIONS: No  FALLS: Has patient fallen in last 6 months? Yes. Number of falls 5 and within last 2 years, noting mostly in the backwards direction, or when stepping over lines.  LIVING ENVIRONMENT: Lives with: lives with their spouse Lives in: House/apartment Stairs: 2+2 steps, railing and grab bars on both sides Has following equipment at home: Single point cane and Quad cane small base  PLOF: Requires assistive device for independence, Needs assistance with ADLs, Needs assistance with homemaking, Needs assistance with gait, Needs assistance with transfers, and wife reports assisting him with most tasks, but he could do them himself if he needed to  PATIENT GOALS: Improve balance, work on improving the L UE and hand  OBJECTIVE:  Note: Objective measures were completed at Evaluation unless otherwise noted.  DIAGNOSTIC FINDINGS:  MRI 01/2022: IMPRESSION: 1. No specific or reversible cause for symptoms. 2. Brain atrophy and chronic small vessel ischemia without notable progression from 2022.    MRI 05/2021: IMPRESSION: No evidence of acute intracranial abnormality.   Small chronic cortical infarct within the high posterior left frontal lobe.   Small chronic lacunar infarcts within the bilateral basal ganglia.   Mild chronic small vessel ischemic changes within the cerebral white matter and pons.   Mild-to-moderate generalized cerebral atrophy. Comparatively mild cerebellar atrophy.   Minimal bilateral ethmoid and right maxillary sinus mucosal thickening.    COGNITION: Overall cognitive status: Within functional limits for tasks assessed   SENSATION: Light touch: Impaired  and L upper thigh pt reports no sensation   COORDINATION: RAMPs: L side impaired, jerky and slower compared to R side H<>Shin: WNL BLE F<>N: impaired  L side, extended time taken to initially raise arm and find finger and chin  POSTURE: rounded shoulders, forward head, and increased thoracic kyphosis  LOWER EXTREMITY ROM:     Active  Right Eval Left Eval  Hip flexion [facility]  Hip extension    Hip abduction    Hip adduction    Hip internal rotation    Hip external rotation    Knee flexion [facility] WFL  Knee extension [facility]  Ankle dorsiflexion [facility]  Ankle plantarflexion    Ankle inversion    Ankle eversion     (Blank rows = not tested)  LOWER EXTREMITY MMT:    MMT Right Eval Left Eval  Hip flexion 5 5  Hip extension    Hip abduction    Hip adduction    Hip internal rotation    Hip external rotation    Knee flexion 5 5  Knee extension 5 5  Ankle dorsiflexion 4+ 4+  Ankle plantarflexion    Ankle inversion    Ankle eversion    (Blank rows = not tested)  UPPER EXTREMITY MMT:  MMT Right eval Left eval  Shoulder flexion 5 4  Shoulder extension    Shoulder abduction    Shoulder adduction    Shoulder extension    Shoulder internal rotation    Shoulder external rotation    Elbow flexion 5 4+  Elbow extension 5 4+  Wrist flexion    Wrist extension    Wrist ulnar deviation    Wrist radial deviation    Wrist pronation    Wrist supination     (Blank rows = not tested)   BED MOBILITY:  Not tested  TRANSFERS: Sit to stand: CGA and Min A  Assistive device utilized: Corporate treasurer base and arm rest     Stand to sit: CGA  Assistive device utilized: Counselling psychologist and arm rest     Chair to chair: CGA  Assistive device utilized: Counselling psychologist and arm rest       STAIRS: Not tested GAIT: Findings: Gait Characteristics: decreased arm swing- Right, decreased arm swing- Left, decreased step length- Right, decreased step length- Left, decreased stride length, decreased hip/knee flexion- Right, and decreased hip/knee flexion- Left, Distance walked: 43m, Assistive device utilized:Quad cane small base and  None, Level of assistance: CGA and Min A, and Comments: Shuffling pattern upon gait initiation, pt reports freezing of gait when steeping over lines and changed surfaces, not visualized during this assessment   FUNCTIONAL TESTS:  -Pt performed 5 time sit<>stand (5xSTS): 16.62 sec (>15 sec indicates increased fall risk)  -10 Meter Walk Test: Patient instructed to walk 10 meters (32.8 ft) as quickly and as safely as possible at their normal speed x2 and at a fast speed x2. Time measured from 2 meter mark to 8 meter mark to accommodate ramp-up and ramp-down.  Normal speed 1: 0.432 m/s Normal speed 2: 0.446 m/s Average Normal speed: 0.439 m/s Cut off scores: <0.4 m/s = household Ambulator, 0.4-0.8 m/s = limited community Ambulator, >0.8 m/s = community Ambulator, >1.2 m/s = crossing a street, <1.0 = increased fall risk MCID 0.05 m/s (small), 0.13 m/s (moderate), 0.06 m/s (significant)  (ANPTA Core Set of Outcome Measures for Adults with Neurologic Conditions, 2018)   -PT instructed pt in TUG: 21.16s without cane, 25.65s with cane sec (average of 3 trials; >13.5 sec indicates increased fall risk)   PATIENT SURVEYS:  The Activities-Specific Balance Confidence (ABC) Scale 0% 10 20 30  40 50 60 70 80 90 100% No confidence<->completely confident  "How confident are you that you will not lose your balance or become unsteady when you . . .   Date tested  05/23/2024  Walk around the house 80%  2. Walk up or down stairs 60%  3. Bend over and pick up a slipper from in front of a closet floor 30%  4. Reach for a small can off a shelf at eye level 100%  5. Stand on tip toes and reach for something above your head 20%  6. Stand on a chair and reach for something 0%  7. Sweep the floor 20%  8. Walk outside the house to a car parked in the driveway 80%  9. Get into or out of a car 40%  10. Walk across a parking lot to the mall 10%  11. Walk up or down a ramp 40%  12. Walk in a crowded mall where people  rapidly walk past you 50%  13. Are bumped into by people as you walk through the mall 10%  14. Step onto or off of an escalator while you are holding onto the railing 30%  15. Step onto or off an escalator while holding onto parcels such that you cannot hold onto the railing 0%  16. Walk outside on icy sidewalks 0%  Total: #/16 35.6                                                                                                                                   TREATMENT DATE: 05/27/2024    NMR:   Dynamic high knee step tap with no UE support 2x10 ea LE  Static stand with eyes closed and feet together on firm surface x 30 sec x 2  Static stand with horizontal head motions x 10  on airex pad  Static stand with Vertical head motions x 10 on airex pad  Static stand with horizontal head motions x 10  on airex pad in adducted stance  Static stand with Vertical head motions x 10 on airex pad in adducted stance  TA:   STS 2 x 8 with cues for forward lean to improve ease of rising and cues for erect posture in standing   Ball placement in various positions for object navigation and training x 10 balls, had to climb and descend stairs for one with R UE assist. Balls all fit in his R UE ( L UE unable to grasp well enough)     PATIENT EDUCATION: Education details: Pt educated on possibility of benefit on OT for L UE and hand therapy, educated on obtaining referral from MD for OT, interpretation of outcome measures Person educated: Patient and Spouse Education method: Explanation Education comprehension: verbalized understanding  HOME EXERCISE PROGRAM: Provide next session.  GOALS: Goals reviewed with patient? No  SHORT TERM GOALS: Target date: 06/17/2024  Patient will be independent with home exercise program to improve strength/mobility for increased functional independence with ADLs and mobility. Baseline: Goal status: INITIAL  SHORT TERM GOALS: Target date: 06/17/2024     Patient will be independent in home exercise  program to improve strength/mobility for better functional independence with ADLs. Baseline: Goal status: INITIAL   LONG TERM GOALS: Target date: 08/12/2024    Patient will increase ABC scale score >80% to demonstrate better functional mobility and better confidence with ADLs.  Baseline:  Goal status: INITIAL  2.  Patient (> 36 years old) will complete five times sit to stand test in < 15 seconds indicating an increased LE strength and improved balance. Baseline: 16.62s Goal status: INITIAL  3.  Patient will increase Berg Balance score by > 6 points to demonstrate decreased fall risk during functional activities Baseline: 11/14= 42/56 Goal status: INITIAL  4.  Patient will increase 10 meter walk test to >1.63m/s as to improve gait speed for better community ambulation and to reduce fall risk. Baseline: 13.89 (0.461m/s), 13.46 (0.464m/s) AVG: 0.472m/s Goal status: INITIAL  5.  Patient will reduce timed up and go to <11 seconds to reduce fall risk and demonstrate improved transfer/gait ability. Baseline: 21.16s without cane, 25.65s with cane Goal status: INITIAL  6.  Patient will increase FGA by > 7 points as to demonstrate reduced fall risk and improved dynamic gait balance for better safety with community/home ambulation.   Baseline: 11/14= 7/30 Goal status: REVISED    ASSESSMENT:  CLINICAL IMPRESSION:  Patient is a 89 y.o. male who was seen today for physical therapy  treatment for difficulty with balance mobility. Pt instructed in proper technique for STS to improve ease of task. Improvement shown when completing with proper instruction and direction following, instructed to continue to work on this at home. Pt challenged with various balance and functional training with overall good result. Imbalance noted with more difficult activities this date.  Patient will benefit from continued skilled physical therapy to address  coordination and balance deficits to improve quality of life.  OBJECTIVE IMPAIRMENTS: Abnormal gait, decreased activity tolerance, decreased balance, decreased coordination, decreased endurance, decreased mobility, difficulty walking, decreased ROM, decreased safety awareness, dizziness, hypomobility, increased fascial restrictions, impaired perceived functional ability, increased muscle spasms, impaired flexibility, impaired sensation, improper body mechanics, and postural dysfunction.   ACTIVITY LIMITATIONS: carrying, lifting, bending, squatting, stairs, transfers, bathing, toileting, dressing, hygiene/grooming, locomotion level, and caring for others  PARTICIPATION LIMITATIONS: cleaning, laundry, driving, shopping, and yard work  PERSONAL FACTORS: Age, Education, Time since onset of injury/illness/exacerbation, and 3+ comorbidities: stroke, HTN, BPH are also affecting patient's functional outcome.   REHAB POTENTIAL: Good  CLINICAL DECISION MAKING: Evolving/moderate complexity  EVALUATION COMPLEXITY: Moderate  PLAN:  PT FREQUENCY: 2x/week  PT DURATION: 12 weeks  PLANNED INTERVENTIONS: 97164- PT Re-evaluation, 97750- Physical Performance Testing, 97110-Therapeutic exercises, 97530- Therapeutic activity, 97112- Neuromuscular re-education, 97535- Self Care, [id number]- Manual therapy, [phone]- Gait training, [phone]- Aquatic Therapy, [phone]- Electrical stimulation (unattended), [phone] (1-2 muscles), [phone] (3+ muscles)- Dry Needling, Patient/Family education, Balance training, Stair training, Joint mobilization, Joint manipulation, Spinal mobilization, Vestibular training, Visual/preceptual remediation/compensation, DME instructions, Wheelchair mobility training, Cryotherapy, and Moist heat  PLAN FOR NEXT SESSION:  -Provide HEP and instruction -begin balance/ambulation/coordination activities  Note: Portions of this document were prepared using Dragon voice recognition software and although reviewed  may contain unintentional dictation errors in syntax, grammar, or spelling.  Lonni KATHEE Gainer PT ,DPT Physical Therapist- [facility]   10:26 AM 06/04/24

## 2024-06-10 ENCOUNTER — Ambulatory Visit

## 2024-06-10 DIAGNOSIS — M6281 Muscle weakness (generalized): Secondary | ICD-10-CM | POA: Diagnosis present

## 2024-06-10 DIAGNOSIS — R278 Other lack of coordination: Secondary | ICD-10-CM

## 2024-06-10 DIAGNOSIS — R262 Difficulty in walking, not elsewhere classified: Secondary | ICD-10-CM | POA: Diagnosis present

## 2024-06-10 DIAGNOSIS — R2689 Other abnormalities of gait and mobility: Secondary | ICD-10-CM | POA: Insufficient documentation

## 2024-06-10 DIAGNOSIS — R2681 Unsteadiness on feet: Secondary | ICD-10-CM | POA: Diagnosis present

## 2024-06-10 NOTE — Therapy (Unsigned)
 OUTPATIENT OCCUPATIONAL THERAPY NEURO EVALUATION  Patient Name: James Herrera. MRN: [id number] DOB:03/22/1934, 89 y.o., male Today's Date: 06/11/2024  PCP: Dr. Tamra Herrera REFERRING PROVIDER: Dr. Arthea Herrera  END OF SESSION:  OT End of Session - 06/11/24 0904     Visit Number 1    Number of Visits 24    Date for Recertification  09/02/24    OT Start Time 1018    OT Stop Time 1100    OT Time Calculation (min) 42 min    Activity Tolerance Patient tolerated treatment well    Behavior During Therapy [facility] for tasks assessed/performed         Past Medical History:  Diagnosis Date   BPH (benign prostatic hyperplasia)    Difficulty balancing    History of kidney stones    History of stroke    Hypertension    Left arm weakness    Neuropathy    Sensory ataxia    Past Surgical History:  Procedure Laterality Date   CATARACT EXTRACTION W/PHACO Right 12/31/2023   Procedure: PHACOEMULSIFICATION, CATARACT, WITH IOL  11.29 01:03.5;  Surgeon: Myrna Adine Anes, MD;  Location: [facility];  Service: Ophthalmology;  Laterality: Right;   CATARACT EXTRACTION W/PHACO Left 02/04/2024   Procedure: PHACOEMULSIFICATION, CATARACT, WITH IOL INSERTION 7.28, 00:43.6;  Surgeon: Myrna Adine Anes, MD;  Location: [facility];  Service: Ophthalmology;  Laterality: Left;   HERNIA REPAIR Right    HERNIA REPAIR Left    HERNIA REPAIR     umbilical   kidney stone removal      x2   PROSTATE BIOPSY N/A 04/22/2020   Procedure: PROSTATE BIOPSY GRAYCE;  Surgeon: Kassie Ozell SAUNDERS, MD;  Location: [facility];  Service: Urology;  Laterality: N/A;   TONSILLECTOMY     There are no active problems to display for this patient.  ONSET DATE: Late 2020 for LUE weakness and hand tremor following covid shot; L hand has worsened further in the last 1.5 years.   REFERRING DIAG: R27.8 (ICD-10-CM) - Other lack of coordination   THERAPY DIAG:  Muscle weakness (generalized)  Other lack of  coordination  Rationale for Evaluation and Treatment: Rehabilitation  SUBJECTIVE:   SUBJECTIVE STATEMENT: Daughter reports that pt's L hand has worsened over the last 1.5 years.   Pt accompanied by: family member, daughter [facility]  PERTINENT HISTORY:  Per OT eval note in 11/23: Patient presents with history of numbness and tingling in the left hand , small chronic cortical infarcts within high bilateral basal ganglia, mild chronic small vessel ischemic changes within the white matter/pons.  Patient reports left upper extremity changes occurred after receiving the COVID shot in 01/27/2020   PRECAUTIONS: Fall  WEIGHT BEARING RESTRICTIONS: No  PAIN:  Are you having pain? Yes: NPRS scale: 0 at eval, but pt experiences brief bouts of mod-severe pain Pain location: L arm or hand  Pain description: shooting pain that comes and goes, and a little arthritis in both thumbs  Aggravating factors: N/A Relieving factors: N/A  FALLS: Has patient fallen in last 6 months? Yes. Number of falls 5-6  LIVING ENVIRONMENT: Lives with: lives with their spouse Lives in: Other 1 level home Stairs: 2 steps from car port Has following equipment at home: Quad cane large base, new bed rail, grab bars all over the house and within bathroom  PLOF: Independent with basic ADLs prior to 2020  PATIENT GOALS: I'd like to be able to hold things with my L arm.  OBJECTIVE:  Note: Objective measures were completed at Evaluation unless otherwise noted.  HAND DOMINANCE: Left  ADLs: Overall ADLs: Pt endorses using R non-dominant arm for daily tasks; pt reports he can hardly do anything bimanually  Transfers/ambulation related to ADLs: uses wide base quad cane, has a lift chair, otherwise performs basic transfers with supv-modified indep Eating: eats with R hand only; assist to cut food  Grooming: R hand only UB Dressing: unable to button LB Dressing: assist with socks; extra time for pants, wears slip on shoes  and elastic waist pants Toileting: modified indep with R non-dominant hand  Bathing: assist with drying back, otherwise modified indep with washing Tub Shower transfers: modified indep Equipment: Grab bars and Walk in shower, shower chair, grab bars throughout the bathroom and house  IADLs: No longer driving, 1 daughter lives in [place] 15 min away that can check in as needed, son also 10 min away and assists as needed  Shopping: reliant on family  Light housekeeping: pt manages most of the laundry with modified indep, paid house cleaner 1x a week  Meal Prep: shared task between pt and daughter Community mobility: uses large base quad cane Medication management: indep with weekly pill Retail Buyer: spouse manages  Handwriting: 100% legible, Increased time, and using R non-dominant hand; assist required to set up pen in R hand  MOBILITY STATUS: Hx of falls  POSTURE COMMENTS:  rounded shoulders, forward head, and increased thoracic kyphosis  ACTIVITY TOLERANCE: Activity tolerance: To be further assessed within functional contexts  FUNCTIONAL OUTCOME MEASURES: MAM-20 for neurological conditions: TBD  UPPER EXTREMITY ROM:  Bilat shoulders grossly WFL  Active ROM Right eval Left eval  Shoulder flexion    Shoulder abduction    Shoulder adduction    Shoulder extension    Shoulder internal rotation    Shoulder external rotation    Elbow flexion    Elbow extension    Wrist flexion    Wrist extension    Wrist ulnar deviation    Wrist radial deviation    Wrist pronation    Wrist supination    (Blank rows = not tested)  UPPER EXTREMITY MMT:  (will complete in follow up session)  MMT Right eval Left eval  Shoulder flexion    Shoulder abduction    Shoulder adduction    Shoulder extension    Shoulder internal rotation    Shoulder external rotation    Middle trapezius    Lower trapezius    Elbow flexion    Elbow extension    Wrist flexion    Wrist  extension    Wrist ulnar deviation    Wrist radial deviation    Wrist pronation    Wrist supination    (Blank rows = not tested)  HAND FUNCTION: Grip strength: Right: 70 lbs; Left: 8 lbs, Lateral pinch: Right: 13 lbs, Left: thumb slips  lbs, and 3 point pinch: Right: 7 lbs, Left: Long fingertip missing lbs R tip pinch 7 lbs, L unable to sustain fingers in place/thumb slips.  COORDINATION: Finger Nose Finger test: L severely ataxic and tremulous  9 Hole Peg test: Right: 59 sec; Left: unable sec (able to remove 1 pre-placed peg with extra time)  SENSATION: WFL  EDEMA: No visible edema  MUSCLE TONE: LUE: Within functional limits  COGNITION: Overall cognitive status: daughter helps to provide accurate timelines for functional decline in L hand  VISION: Pt reports vision has improved since cataract sx  PERCEPTION: Not tested  PRAXIS: Impaired: Motor planning; pt describes difficulty releasing items from L hand   OBSERVATIONS:  Pt pleasant and cooperative and receptive to OT poc.                                                                                      TREATMENT DATE: 06/10/24 Evaluation completed.  Self Care: -Education provided on OT role, goals, poc   PATIENT EDUCATION: Education details: OT role Person educated: Patient and Child(ren) Education method: Explanation and Verbal cues Education comprehension: verbalized understanding  HOME EXERCISE PROGRAM: Not yet initiated  GOALS: Goals reviewed with patient? Yes  SHORT TERM GOALS: Target date: 07/22/24  Pt will be indep to perform HEP for improving LUE strength and coordination for daily tasks. Baseline: Eval: Not yet initiated Goal status: INITIAL  LONG TERM GOALS: Target date: 09/02/24  Pt will increase MAM-20 score for neurological conditions by (TBD) or more points to indicate improvement in self perceived functional use of the L hand for daily tasks.  Baseline: Eval: TBD Goal status: INITIAL  2.   Pt will increase L grip strength by 10 or more lbs to enable pt to carry light ADL supplies in L dominant hand.  Baseline: Eval: L 8 lbs, R 70 lbs Goal status: INITIAL  3.  Pt will improve L hand coordination skills to be able to cut food with modified indep.   Baseline: Eval: Assist to cut food. Goal status: INITIAL  4.  Pt will verbalize and demonstrate 2-3 compensatory strategies to manage LUE tremors for improved efficiency and indep with ADL/IADL tasks.  Baseline: Eval: Education not yet initiated. Goal status: INITIAL  ASSESSMENT:  CLINICAL IMPRESSION: Patient is a *** y.o. *** who was seen today for occupational therapy evaluation for ***.   PERFORMANCE DEFICITS: in functional skills including {OT physical skills:25468}, cognitive skills including {OT cognitive skills:25469}, and psychosocial skills including {OT psychosocial skills:25470}.   IMPAIRMENTS: are limiting patient from {OT performance deficits:25471}.   CO-MORBIDITIES: {Comorbidities:25485} that affects occupational performance. Patient will benefit from skilled OT to address above impairments and improve overall function.  MODIFICATION OR ASSISTANCE TO COMPLETE EVALUATION: {OT modification:25474}  OT OCCUPATIONAL PROFILE AND HISTORY: {OT PROFILE AND HISTORY:25484}  CLINICAL DECISION MAKING: {OT CDM:25475}  REHAB POTENTIAL: {rehabpotential:25112}  EVALUATION COMPLEXITY: {Evaluation complexity:25115}    PLAN:  OT FREQUENCY: {rehab frequency:25116}  OT DURATION: {rehab duration:25117}  PLANNED INTERVENTIONS: {OT Interventions:25467}  RECOMMENDED OTHER SERVICES: ***  CONSULTED AND AGREED WITH PLAN OF CARE: {ENR:74513}  PLAN FOR NEXT SESSION: ***   Inocente MARLA Blazing, OT 06/11/2024, 9:05 AM

## 2024-06-10 NOTE — Therapy (Signed)
 OUTPATIENT PHYSICAL THERAPY NEURO TREATMENT   Patient Name: James Herrera. MRN: [id number] DOB:[birth year], 89 y.o., male Today's Date: 06/10/2024   PCP: Sadie Manna, MD  REFERRING PROVIDER: Lane Arthea FORBES, MD  END OF SESSION:  PT End of Session - 06/10/24 1054     Visit Number 5    Number of Visits 24    Date for Recertification  08/12/24    Progress Note Due on Visit 10    Equipment Utilized During Treatment Gait belt    Activity Tolerance Patient tolerated treatment well    Behavior During Therapy [facility] for tasks assessed/performed            Past Medical History:  Diagnosis Date   BPH (benign prostatic hyperplasia)    Difficulty balancing    History of kidney stones    History of stroke    Hypertension    Left arm weakness    Neuropathy    Sensory ataxia    Past Surgical History:  Procedure Laterality Date   CATARACT EXTRACTION W/PHACO Right 12/31/2023   Procedure: PHACOEMULSIFICATION, CATARACT, WITH IOL  11.29 01:03.5;  Surgeon: Myrna Adine Anes, MD;  Location: [facility];  Service: Ophthalmology;  Laterality: Right;   CATARACT EXTRACTION W/PHACO Left 02/04/2024   Procedure: PHACOEMULSIFICATION, CATARACT, WITH IOL INSERTION 7.28, 00:43.6;  Surgeon: Myrna Adine Anes, MD;  Location: [facility];  Service: Ophthalmology;  Laterality: Left;   HERNIA REPAIR Right    HERNIA REPAIR Left    HERNIA REPAIR     umbilical   kidney stone removal      x2   PROSTATE BIOPSY N/A 04/22/2020   Procedure: PROSTATE BIOPSY GRAYCE;  Surgeon: Kassie Ozell SAUNDERS, MD;  Location: [facility];  Service: Urology;  Laterality: N/A;   TONSILLECTOMY     There are no active problems to display for this patient.   ONSET DATE: >6 months  REFERRING DIAG: Per recent progress note: Leg weakness and imbalance  THERAPY DIAG:  Other abnormalities of gait and mobility  Difficulty in walking, not elsewhere classified  Muscle weakness  (generalized)  Unsteadiness on feet  Other lack of coordination  Rationale for Evaluation and Treatment: Rehabilitation  SUBJECTIVE:                                                                                                                                                                                             SUBJECTIVE STATEMENT: From Today: Patient states doing well without any new concerns. Denies any pain or falls. He reports that he does feel a little shaky today. He did fall a week and a  half ago when stepping on stool to get into bed. He has since lowered the bed to make getting into/out of easier.   From EVAL: Pt reports wife suggested he return to therapy. Pt reports after last bout of therapy, he had reached his limit of improvement, but decided he wanted to try again after his balance has declined since then. Pt reports he has trouble holding things in his hand, and that grasping is difficult. Pt reports his L arm and hand have been the biggest problem for him. Pt reports he notices that when he crosses a line on the floor he has visual issues where he has to be careful to prevent LOB. Pt reports 5  falls within the last 2 years due to carelessness and dizziness. Reports his tendency when falling is backwards. Reports issues with dressing and putting pants on, and that he uses pull on pants.  Pt accompanied by: wife  PERTINENT HISTORY: Per recent progress note by Sherran Milon Berliner, PA: LEFT ARM WEAKNESS/ BALANCE DIFFICULTY/ HISTORY OF STROKE / SENSORY ATAXIA/ BACK PAIN   Ongoing  Patient with past medical history significant for stroke presents for follow-up accordingly, also follow-up of neuropathy and tremor.  Since last office visit he has self tapered off of gabapentin to 400 mg nightly.  His primary concern today is ongoing bilateral hand tremor, worse in his left and with action or increased stress.  He also notes imbalance.  Referral to physical therapy for  leg weakness and imbalance  PAIN:  Are you having pain? Yes: NPRS scale: 4-5/10 Pain location: L hand and UE Pain description: tight, aching Aggravating factors: none Relieving factors: sometimes 1 aspirin and NSAIDs  PRECAUTIONS: Fall  RED FLAGS: None   WEIGHT BEARING RESTRICTIONS: No  FALLS: Has patient fallen in last 6 months? Yes. Number of falls 5 and within last 2 years, noting mostly in the backwards direction, or when stepping over lines.  LIVING ENVIRONMENT: Lives with: lives with their spouse Lives in: House/apartment Stairs: 2+2 steps, railing and grab bars on both sides Has following equipment at home: Single point cane and Quad cane small base  PLOF: Requires assistive device for independence, Needs assistance with ADLs, Needs assistance with homemaking, Needs assistance with gait, Needs assistance with transfers, and wife reports assisting him with most tasks, but he could do them himself if he needed to  PATIENT GOALS: Improve balance, work on improving the L UE and hand  OBJECTIVE:  Note: Objective measures were completed at Evaluation unless otherwise noted.  DIAGNOSTIC FINDINGS:  MRI 01/2022: IMPRESSION: 1. No specific or reversible cause for symptoms. 2. Brain atrophy and chronic small vessel ischemia without notable progression from 2022.    MRI 05/2021: IMPRESSION: No evidence of acute intracranial abnormality.   Small chronic cortical infarct within the high posterior left frontal lobe.   Small chronic lacunar infarcts within the bilateral basal ganglia.   Mild chronic small vessel ischemic changes within the cerebral white matter and pons.   Mild-to-moderate generalized cerebral atrophy. Comparatively mild cerebellar atrophy.   Minimal bilateral ethmoid and right maxillary sinus mucosal thickening.    COGNITION: Overall cognitive status: Within functional limits for tasks assessed   SENSATION: Light touch: Impaired  and L upper  thigh pt reports no sensation   COORDINATION: RAMPs: L side impaired, jerky and slower compared to R side H<>Shin: WNL BLE F<>N: impaired L side, extended time taken to initially raise arm and find finger and chin  POSTURE: rounded shoulders,  forward head, and increased thoracic kyphosis  LOWER EXTREMITY ROM:     Active  Right Eval Left Eval  Hip flexion [facility]  Hip extension    Hip abduction    Hip adduction    Hip internal rotation    Hip external rotation    Knee flexion [facility] WFL  Knee extension [facility]  Ankle dorsiflexion [facility]  Ankle plantarflexion    Ankle inversion    Ankle eversion     (Blank rows = not tested)  LOWER EXTREMITY MMT:    MMT Right Eval Left Eval  Hip flexion 5 5  Hip extension    Hip abduction    Hip adduction    Hip internal rotation    Hip external rotation    Knee flexion 5 5  Knee extension 5 5  Ankle dorsiflexion 4+ 4+  Ankle plantarflexion    Ankle inversion    Ankle eversion    (Blank rows = not tested)  UPPER EXTREMITY MMT:  MMT Right eval Left eval  Shoulder flexion 5 4  Shoulder extension    Shoulder abduction    Shoulder adduction    Shoulder extension    Shoulder internal rotation    Shoulder external rotation    Elbow flexion 5 4+  Elbow extension 5 4+  Wrist flexion    Wrist extension    Wrist ulnar deviation    Wrist radial deviation    Wrist pronation    Wrist supination     (Blank rows = not tested)   BED MOBILITY:  Not tested  TRANSFERS: Sit to stand: CGA and Min A  Assistive device utilized: Corporate treasurer base and arm rest     Stand to sit: CGA  Assistive device utilized: Counselling psychologist and arm rest     Chair to chair: CGA  Assistive device utilized: Counselling psychologist and arm rest       STAIRS: Not tested GAIT: Findings: Gait Characteristics: decreased arm swing- Right, decreased arm swing- Left, decreased step length- Right, decreased step length- Left, decreased stride length,  decreased hip/knee flexion- Right, and decreased hip/knee flexion- Left, Distance walked: 45m, Assistive device utilized:Quad cane small base and None, Level of assistance: CGA and Min A, and Comments: Shuffling pattern upon gait initiation, pt reports freezing of gait when steeping over lines and changed surfaces, not visualized during this assessment   FUNCTIONAL TESTS:  -Pt performed 5 time sit<>stand (5xSTS): 16.62 sec (>15 sec indicates increased fall risk)  -10 Meter Walk Test: Patient instructed to walk 10 meters (32.8 ft) as quickly and as safely as possible at their normal speed x2 and at a fast speed x2. Time measured from 2 meter mark to 8 meter mark to accommodate ramp-up and ramp-down.  Normal speed 1: 0.432 m/s Normal speed 2: 0.446 m/s Average Normal speed: 0.439 m/s Cut off scores: <0.4 m/s = household Ambulator, 0.4-0.8 m/s = limited community Ambulator, >0.8 m/s = community Ambulator, >1.2 m/s = crossing a street, <1.0 = increased fall risk MCID 0.05 m/s (small), 0.13 m/s (moderate), 0.06 m/s (significant)  (ANPTA Core Set of Outcome Measures for Adults with Neurologic Conditions, 2018)   -PT instructed pt in TUG: 21.16s without cane, 25.65s with cane sec (average of 3 trials; >13.5 sec indicates increased fall risk)   PATIENT SURVEYS:  The Activities-Specific Balance Confidence (ABC) Scale 0% 10 20 30  40 50 60 70 80 90 100% No confidence<->completely confident  "How confident  are you that you will not lose your balance or become unsteady when you . . .   Date tested 05/23/2024  Walk around the house 80%  2. Walk up or down stairs 60%  3. Bend over and pick up a slipper from in front of a closet floor 30%  4. Reach for a small can off a shelf at eye level 100%  5. Stand on tip toes and reach for something above your head 20%  6. Stand on a chair and reach for something 0%  7. Sweep the floor 20%  8. Walk outside the house to a car parked in the driveway 80%  9. Get  into or out of a car 40%  10. Walk across a parking lot to the mall 10%  11. Walk up or down a ramp 40%  12. Walk in a crowded mall where people rapidly walk past you 50%  13. Are bumped into by people as you walk through the mall 10%  14. Step onto or off of an escalator while you are holding onto the railing 30%  15. Step onto or off an escalator while holding onto parcels such that you cannot hold onto the railing 0%  16. Walk outside on icy sidewalks 0%  Total: #/16 35.6                                                                                                                                   TREATMENT DATE: 05/27/2024    NMR:    Dynamic high knee step tap with no UE support 15x ea LE on 6'' step.  Static stand with eyes closed and feet together on firm surface x 30 sec x 2  Static standing with one large step one foot in front of other 2x30'' holds.   Obstacle course: Stepping on top of and off [employer] (2), stepping over 1/2 roll. X3 laps.    TA:  Stairs x4 laps with step-to pattern and min. Support with  RUE leading.   STS 2 x 10 with cues for forward lean to improve ease of rising and cues for erect posture in standing.   GT: 2 laps (300') without AD working on geologist, engineering.  GT: Backward 66'   PATIENT EDUCATION: Education details: Pt educated on possibility of benefit on OT for L UE and hand therapy, educated on obtaining referral from MD for OT, interpretation of outcome measures Person educated: Patient and Spouse Education method: Explanation Education comprehension: verbalized understanding  HOME EXERCISE PROGRAM: Provide next session.  GOALS: Goals reviewed with patient? No  SHORT TERM GOALS: Target date: 06/17/2024  Patient will be independent with home exercise program to improve strength/mobility for increased functional independence with ADLs and mobility. Baseline: Goal status: INITIAL  SHORT TERM GOALS: Target date: 06/17/2024    Patient will  be independent in home exercise program to improve strength/mobility for better functional independence with ADLs. Baseline:  Goal status: INITIAL   LONG TERM GOALS: Target date: 08/12/2024    Patient will increase ABC scale score >80% to demonstrate better functional mobility and better confidence with ADLs.  Baseline:  Goal status: INITIAL  2.  Patient (> 38 years old) will complete five times sit to stand test in < 15 seconds indicating an increased LE strength and improved balance. Baseline: 16.62s Goal status: INITIAL  3.  Patient will increase Berg Balance score by > 6 points to demonstrate decreased fall risk during functional activities Baseline: 11/14= 42/56 Goal status: INITIAL  4.  Patient will increase 10 meter walk test to >1.18m/s as to improve gait speed for better community ambulation and to reduce fall risk. Baseline: 13.89 (0.466m/s), 13.46 (0.444m/s) AVG: 0.444m/s Goal status: INITIAL  5.  Patient will reduce timed up and go to <11 seconds to reduce fall risk and demonstrate improved transfer/gait ability. Baseline: 21.16s without cane, 25.65s with cane Goal status: INITIAL  6.  Patient will increase FGA by > 7 points as to demonstrate reduced fall risk and improved dynamic gait balance for better safety with community/home ambulation.   Baseline: 11/14= 7/30 Goal status: REVISED    ASSESSMENT:  CLINICAL IMPRESSION:  Patient is a 89 y.o. male who was seen today for physical therapy  treatment for difficulty with balance mobility. Pt instructed in proper technique for STS to improve ease of task. He continues to have difficulty repeating the technique throughout treatment requiring additional verbal cues, however improvement noted initially following cues. Patient worked to improve animator today with increased stride length and speed. He also performed various balance activities including obstacle course for improved dynamic balance allowing him to ambulate  over thresholds and curbs with less difficulty. Patient has significant hesitancy when performing obstacle course today requiring verbal cuing for positioning and motivation with positive feedback. I educated patient on pros to a [facility] as he tends to lift his quad cane up off ground and not touch the ground with it when he walks, but rather uses it for security. Pt continues to work hard during treatment with good motivation and response to cuing throughout POC. Imbalance noted with more difficult activities this date. Patient will benefit from continued skilled physical therapy to address coordination and balance deficits to improve quality of life.  OBJECTIVE IMPAIRMENTS: Abnormal gait, decreased activity tolerance, decreased balance, decreased coordination, decreased endurance, decreased mobility, difficulty walking, decreased ROM, decreased safety awareness, dizziness, hypomobility, increased fascial restrictions, impaired perceived functional ability, increased muscle spasms, impaired flexibility, impaired sensation, improper body mechanics, and postural dysfunction.   ACTIVITY LIMITATIONS: carrying, lifting, bending, squatting, stairs, transfers, bathing, toileting, dressing, hygiene/grooming, locomotion level, and caring for others  PARTICIPATION LIMITATIONS: cleaning, laundry, driving, shopping, and yard work  PERSONAL FACTORS: Age, Education, Time since onset of injury/illness/exacerbation, and 3+ comorbidities: stroke, HTN, BPH are also affecting patient's functional outcome.   REHAB POTENTIAL: Good  CLINICAL DECISION MAKING: Evolving/moderate complexity  EVALUATION COMPLEXITY: Moderate  PLAN:  PT FREQUENCY: 2x/week  PT DURATION: 12 weeks  PLANNED INTERVENTIONS: 97164- PT Re-evaluation, 97750- Physical Performance Testing, 97110-Therapeutic exercises, 97530- Therapeutic activity, 97112- Neuromuscular re-education, 97535- Self Care, [id number]- Manual therapy, [phone]- Gait training, [phone]-  Aquatic Therapy, [phone]- Electrical stimulation (unattended), [phone] (1-2 muscles), [phone] (3+ muscles)- Dry Needling, Patient/Family education, Balance training, Stair training, Joint mobilization, Joint manipulation, Spinal mobilization, Vestibular training, Visual/preceptual remediation/compensation, DME instructions, Wheelchair mobility training, Cryotherapy, and Moist heat  PLAN FOR NEXT SESSION:  -Continue working to improve dynamic balance and  gait with obstacles such allowing him to cross thresholds and uneven surfaces with less difficulty.  Note: Portions of this document were prepared using Dragon voice recognition software and although reviewed may contain unintentional dictation errors in syntax, grammar, or spelling.  Norman KATHEE Sharps PT ,DPT Physical Therapist- [place]  [facility]   10:55 AM 06/10/24

## 2024-06-12 ENCOUNTER — Ambulatory Visit: Admitting: Physical Therapy

## 2024-06-13 ENCOUNTER — Ambulatory Visit

## 2024-06-13 DIAGNOSIS — R2681 Unsteadiness on feet: Secondary | ICD-10-CM

## 2024-06-13 DIAGNOSIS — R278 Other lack of coordination: Secondary | ICD-10-CM

## 2024-06-13 DIAGNOSIS — M6281 Muscle weakness (generalized): Secondary | ICD-10-CM

## 2024-06-13 DIAGNOSIS — R2689 Other abnormalities of gait and mobility: Secondary | ICD-10-CM | POA: Diagnosis not present

## 2024-06-13 DIAGNOSIS — R262 Difficulty in walking, not elsewhere classified: Secondary | ICD-10-CM

## 2024-06-13 NOTE — Therapy (Signed)
 OUTPATIENT PHYSICAL THERAPY NEURO TREATMENT   Patient Name: James Herrera. MRN: [id number] DOB:12-07-1933, 89 y.o., male Today's Date: 06/13/2024   PCP: Sadie Manna, MD  REFERRING PROVIDER: Lane Arthea FORBES, MD  END OF SESSION:  PT End of Session - 06/13/24 1012     Visit Number 6    Number of Visits 24    Date for Recertification  08/12/24    Progress Note Due on Visit 10    PT Start Time 0930    PT Stop Time 1013    PT Time Calculation (min) 43 min    Equipment Utilized During Treatment Gait belt    Activity Tolerance Patient tolerated treatment well    Behavior During Therapy WFL for tasks assessed/performed            Past Medical History:  Diagnosis Date   BPH (benign prostatic hyperplasia)    Difficulty balancing    History of kidney stones    History of stroke    Hypertension    Left arm weakness    Neuropathy    Sensory ataxia    Past Surgical History:  Procedure Laterality Date   CATARACT EXTRACTION W/PHACO Right 12/31/2023   Procedure: PHACOEMULSIFICATION, CATARACT, WITH IOL  11.29 01:03.5;  Surgeon: Myrna Adine Anes, MD;  Location: [facility];  Service: Ophthalmology;  Laterality: Right;   CATARACT EXTRACTION W/PHACO Left 02/04/2024   Procedure: PHACOEMULSIFICATION, CATARACT, WITH IOL INSERTION 7.28, 00:43.6;  Surgeon: Myrna Adine Anes, MD;  Location: [facility];  Service: Ophthalmology;  Laterality: Left;   HERNIA REPAIR Right    HERNIA REPAIR Left    HERNIA REPAIR     umbilical   kidney stone removal      x2   PROSTATE BIOPSY N/A 04/22/2020   Procedure: PROSTATE BIOPSY GRAYCE;  Surgeon: Kassie Ozell SAUNDERS, MD;  Location: [facility];  Service: Urology;  Laterality: N/A;   TONSILLECTOMY     There are no active problems to display for this patient.   ONSET DATE: >6 months  REFERRING DIAG: Per recent progress note: Leg weakness and imbalance  THERAPY DIAG:  Muscle weakness (generalized)  Other lack of  coordination  Other abnormalities of gait and mobility  Difficulty in walking, not elsewhere classified  Unsteadiness on feet  Rationale for Evaluation and Treatment: Rehabilitation  SUBJECTIVE:                                                                                                                                                                                             SUBJECTIVE STATEMENT: From Today: Patient arrived with family member. He reports  doing well today. He reports that he is usually shaky in the morning and it improves throughout the day. He denies any recent falls since previous visit.   From EVAL: Pt reports wife suggested he return to therapy. Pt reports after last bout of therapy, he had reached his limit of improvement, but decided he wanted to try again after his balance has declined since then. Pt reports he has trouble holding things in his hand, and that grasping is difficult. Pt reports his L arm and hand have been the biggest problem for him. Pt reports he notices that when he crosses a line on the floor he has visual issues where he has to be careful to prevent LOB. Pt reports 5  falls within the last 2 years due to carelessness and dizziness. Reports his tendency when falling is backwards. Reports issues with dressing and putting pants on, and that he uses pull on pants.  Pt accompanied by: wife  PERTINENT HISTORY: Per recent progress note by Sherran Milon Berliner, PA: LEFT ARM WEAKNESS/ BALANCE DIFFICULTY/ HISTORY OF STROKE / SENSORY ATAXIA/ BACK PAIN   Ongoing  Patient with past medical history significant for stroke presents for follow-up accordingly, also follow-up of neuropathy and tremor.  Since last office visit he has self tapered off of gabapentin to 400 mg nightly.  His primary concern today is ongoing bilateral hand tremor, worse in his left and with action or increased stress.  He also notes imbalance.  Referral to physical therapy for leg  weakness and imbalance  PAIN:  Are you having pain? Yes: NPRS scale: 4-5/10 Pain location: L hand and UE Pain description: tight, aching Aggravating factors: none Relieving factors: sometimes 1 aspirin and NSAIDs  PRECAUTIONS: Fall  RED FLAGS: None   WEIGHT BEARING RESTRICTIONS: No  FALLS: Has patient fallen in last 6 months? Yes. Number of falls 5 and within last 2 years, noting mostly in the backwards direction, or when stepping over lines.  LIVING ENVIRONMENT: Lives with: lives with their spouse Lives in: House/apartment Stairs: 2+2 steps, railing and grab bars on both sides Has following equipment at home: Single point cane and Quad cane small base  PLOF: Requires assistive device for independence, Needs assistance with ADLs, Needs assistance with homemaking, Needs assistance with gait, Needs assistance with transfers, and wife reports assisting him with most tasks, but he could do them himself if he needed to  PATIENT GOALS: Improve balance, work on improving the L UE and hand  OBJECTIVE:  Note: Objective measures were completed at Evaluation unless otherwise noted.  DIAGNOSTIC FINDINGS:  MRI 01/2022: IMPRESSION: 1. No specific or reversible cause for symptoms. 2. Brain atrophy and chronic small vessel ischemia without notable progression from 2022.    MRI 05/2021: IMPRESSION: No evidence of acute intracranial abnormality.   Small chronic cortical infarct within the high posterior left frontal lobe.   Small chronic lacunar infarcts within the bilateral basal ganglia.   Mild chronic small vessel ischemic changes within the cerebral white matter and pons.   Mild-to-moderate generalized cerebral atrophy. Comparatively mild cerebellar atrophy.   Minimal bilateral ethmoid and right maxillary sinus mucosal thickening.    COGNITION: Overall cognitive status: Within functional limits for tasks assessed   SENSATION: Light touch: Impaired  and L upper thigh  pt reports no sensation   COORDINATION: RAMPs: L side impaired, jerky and slower compared to R side H<>Shin: WNL BLE F<>N: impaired L side, extended time taken to initially raise arm and find finger and  chin  POSTURE: rounded shoulders, forward head, and increased thoracic kyphosis  LOWER EXTREMITY ROM:     Active  Right Eval Left Eval  Hip flexion [facility]  Hip extension    Hip abduction    Hip adduction    Hip internal rotation    Hip external rotation    Knee flexion [facility] WFL  Knee extension [facility]  Ankle dorsiflexion [facility]  Ankle plantarflexion    Ankle inversion    Ankle eversion     (Blank rows = not tested)  LOWER EXTREMITY MMT:    MMT Right Eval Left Eval  Hip flexion 5 5  Hip extension    Hip abduction    Hip adduction    Hip internal rotation    Hip external rotation    Knee flexion 5 5  Knee extension 5 5  Ankle dorsiflexion 4+ 4+  Ankle plantarflexion    Ankle inversion    Ankle eversion    (Blank rows = not tested)  UPPER EXTREMITY MMT:  MMT Right eval Left eval  Shoulder flexion 5 4  Shoulder extension    Shoulder abduction    Shoulder adduction    Shoulder extension    Shoulder internal rotation    Shoulder external rotation    Elbow flexion 5 4+  Elbow extension 5 4+  Wrist flexion    Wrist extension    Wrist ulnar deviation    Wrist radial deviation    Wrist pronation    Wrist supination     (Blank rows = not tested)   BED MOBILITY:  Not tested  TRANSFERS: Sit to stand: CGA and Min A  Assistive device utilized: Corporate treasurer base and arm rest     Stand to sit: CGA  Assistive device utilized: Counselling psychologist and arm rest     Chair to chair: CGA  Assistive device utilized: Counselling psychologist and arm rest       STAIRS: Not tested GAIT: Findings: Gait Characteristics: decreased arm swing- Right, decreased arm swing- Left, decreased step length- Right, decreased step length- Left, decreased stride length, decreased  hip/knee flexion- Right, and decreased hip/knee flexion- Left, Distance walked: 72m, Assistive device utilized:Quad cane small base and None, Level of assistance: CGA and Min A, and Comments: Shuffling pattern upon gait initiation, pt reports freezing of gait when steeping over lines and changed surfaces, not visualized during this assessment   FUNCTIONAL TESTS:  -Pt performed 5 time sit<>stand (5xSTS): 16.62 sec (>15 sec indicates increased fall risk)  -10 Meter Walk Test: Patient instructed to walk 10 meters (32.8 ft) as quickly and as safely as possible at their normal speed x2 and at a fast speed x2. Time measured from 2 meter mark to 8 meter mark to accommodate ramp-up and ramp-down.  Normal speed 1: 0.432 m/s Normal speed 2: 0.446 m/s Average Normal speed: 0.439 m/s Cut off scores: <0.4 m/s = household Ambulator, 0.4-0.8 m/s = limited community Ambulator, >0.8 m/s = community Ambulator, >1.2 m/s = crossing a street, <1.0 = increased fall risk MCID 0.05 m/s (small), 0.13 m/s (moderate), 0.06 m/s (significant)  (ANPTA Core Set of Outcome Measures for Adults with Neurologic Conditions, 2018)   -PT instructed pt in TUG: 21.16s without cane, 25.65s with cane sec (average of 3 trials; >13.5 sec indicates increased fall risk)   PATIENT SURVEYS:  The Activities-Specific Balance Confidence (ABC) Scale 0% 10 20 30  40 50 60 70 80 90 100% No  confidence<->completely confident  "How confident are you that you will not lose your balance or become unsteady when you . . .   Date tested 05/23/2024  Walk around the house 80%  2. Walk up or down stairs 60%  3. Bend over and pick up a slipper from in front of a closet floor 30%  4. Reach for a small can off a shelf at eye level 100%  5. Stand on tip toes and reach for something above your head 20%  6. Stand on a chair and reach for something 0%  7. Sweep the floor 20%  8. Walk outside the house to a car parked in the driveway 80%  9. Get into or out  of a car 40%  10. Walk across a parking lot to the mall 10%  11. Walk up or down a ramp 40%  12. Walk in a crowded mall where people rapidly walk past you 50%  13. Are bumped into by people as you walk through the mall 10%  14. Step onto or off of an escalator while you are holding onto the railing 30%  15. Step onto or off an escalator while holding onto parcels such that you cannot hold onto the railing 0%  16. Walk outside on icy sidewalks 0%  Total: #/16 35.6                                                                                                                                   TREATMENT DATE: 06/13/2024  -Gait (3 laps) 450' without AD working to improve gait speed and stride length.   -Backward walking x60' for improved dynamic balance.   -Sit to stands form chair holding 3 kg 3x5 for improved efficiency/decreased difficulty with transfers at home.   -Romberg stance on Airex: 1x30'' with EO and 2x30'' with EC.   -Airex perturbations in all directions x30  -Stairs: 5x up/down working on reciprocating pattern.   -Step up onto airex, 3'' mat, and over 1/2 roll x2 laps for improved balance. (Went down to one (L) knee on mat due to LOB, but was able to stand back up with assist of second PT). Patient denied any pain or concerns following the fall.   -Toe taps up on to 6'' step x15 each side.    PATIENT EDUCATION: Education details: Pt educated on possibility of benefit on OT for L UE and hand therapy, educated on obtaining referral from MD for OT, interpretation of outcome measures Person educated: Patient and Spouse Education method: Explanation Education comprehension: verbalized understanding  HOME EXERCISE PROGRAM: Provide next session.  GOALS: Goals reviewed with patient? No  SHORT TERM GOALS: Target date: 06/17/2024  Patient will be independent with home exercise program to improve strength/mobility for increased functional independence with ADLs and  mobility. Baseline: Goal status: INITIAL  SHORT TERM GOALS: Target date: 06/17/2024    Patient will be independent in home exercise  program to improve strength/mobility for better functional independence with ADLs. Baseline: Goal status: INITIAL   LONG TERM GOALS: Target date: 08/12/2024    Patient will increase ABC scale score >80% to demonstrate better functional mobility and better confidence with ADLs.  Baseline:  Goal status: INITIAL  2.  Patient (> 57 years old) will complete five times sit to stand test in < 15 seconds indicating an increased LE strength and improved balance. Baseline: 16.62s Goal status: INITIAL  3.  Patient will increase Berg Balance score by > 6 points to demonstrate decreased fall risk during functional activities Baseline: 11/14= 42/56 Goal status: INITIAL  4.  Patient will increase 10 meter walk test to >1.53m/s as to improve gait speed for better community ambulation and to reduce fall risk. Baseline: 13.89 (0.473m/s), 13.46 (0.4106m/s) AVG: 0.426m/s Goal status: INITIAL  5.  Patient will reduce timed up and go to <11 seconds to reduce fall risk and demonstrate improved transfer/gait ability. Baseline: 21.16s without cane, 25.65s with cane Goal status: INITIAL  6.  Patient will increase FGA by > 7 points as to demonstrate reduced fall risk and improved dynamic gait balance for better safety with community/home ambulation.   Baseline: 11/14= 7/30 Goal status: REVISED    ASSESSMENT:  CLINICAL IMPRESSION:  Patient began with gait x450' with verb cues for increased stride length and increased gait speed. He responded well to cuing, but does still show slight ataxia with gait, esp. L foot requiring CGA for safety. Various dynamic balance exercises performed including toe taps, romberg stance with EC, Airex stance with perturbations, and stepping up and onto uneven surfaces. Patient noted to have most difficulty maintaining balance when LOB is  posterior. Decreased reaction times noted with stepping strategy to maintain balance. Pt L knee did buckle when he was stepping up onto 2'' foam pad resulting in patient going to 1/2 kneel position. Another PT assisted patient from 1/2 kneel position to standing. Patient reported he was not hurt during the incident and felt fine. Backward walking performed to improve balance when stepping backward while performing activities around home and in community. Overall, patient performed fair as he continues to have very difficult time with balance when stepping up/down uneven surfaces, even if height is very small (2-3''). He does work hard to improve and is very motivated throughout his treatment session.   OBJECTIVE IMPAIRMENTS: Abnormal gait, decreased activity tolerance, decreased balance, decreased coordination, decreased endurance, decreased mobility, difficulty walking, decreased ROM, decreased safety awareness, dizziness, hypomobility, increased fascial restrictions, impaired perceived functional ability, increased muscle spasms, impaired flexibility, impaired sensation, improper body mechanics, and postural dysfunction.   ACTIVITY LIMITATIONS: carrying, lifting, bending, squatting, stairs, transfers, bathing, toileting, dressing, hygiene/grooming, locomotion level, and caring for others  PARTICIPATION LIMITATIONS: cleaning, laundry, driving, shopping, and yard work  PERSONAL FACTORS: Age, Education, Time since onset of injury/illness/exacerbation, and 3+ comorbidities: stroke, HTN, BPH are also affecting patient's functional outcome.   REHAB POTENTIAL: Good  CLINICAL DECISION MAKING: Evolving/moderate complexity  EVALUATION COMPLEXITY: Moderate  PLAN:  PT FREQUENCY: 2x/week  PT DURATION: 12 weeks  PLANNED INTERVENTIONS: 97164- PT Re-evaluation, 97750- Physical Performance Testing, 97110-Therapeutic exercises, 97530- Therapeutic activity, 97112- Neuromuscular re-education, 97535- Self Care,  97140- Manual therapy, [phone]- Gait training, 02886- Aquatic Therapy, [phone]- Electrical stimulation (unattended), [phone] (1-2 muscles), [id number] (3+ muscles)- Dry Needling, Patient/Family education, Balance training, Stair training, Joint mobilization, Joint manipulation, Spinal mobilization, Vestibular training, Visual/preceptual remediation/compensation, DME instructions, Wheelchair mobility training, Cryotherapy, and Moist heat  PLAN FOR NEXT SESSION:  -  Continue working to improve dynamic balance and gait with obstacles such allowing him to cross thresholds and uneven surfaces with less difficulty.  Note: Portions of this document were prepared using Dragon voice recognition software and although reviewed may contain unintentional dictation errors in syntax, grammar, or spelling.  Norman KATHEE Sharps PT ,DPT Physical Therapist- [facility]   10:14 AM 06/13/24

## 2024-06-17 ENCOUNTER — Ambulatory Visit: Admitting: Physical Therapy

## 2024-06-17 ENCOUNTER — Encounter: Payer: Self-pay | Admitting: Physical Therapy

## 2024-06-17 DIAGNOSIS — R278 Other lack of coordination: Secondary | ICD-10-CM

## 2024-06-17 DIAGNOSIS — R2689 Other abnormalities of gait and mobility: Secondary | ICD-10-CM | POA: Diagnosis not present

## 2024-06-17 DIAGNOSIS — R262 Difficulty in walking, not elsewhere classified: Secondary | ICD-10-CM

## 2024-06-17 DIAGNOSIS — M6281 Muscle weakness (generalized): Secondary | ICD-10-CM

## 2024-06-17 DIAGNOSIS — R2681 Unsteadiness on feet: Secondary | ICD-10-CM

## 2024-06-17 NOTE — Therapy (Unsigned)
 OUTPATIENT PHYSICAL THERAPY NEURO TREATMENT   Patient Name: James Herrera. MRN: [id number] DOB:Nov 10, 1933, 89 y.o., male Today's Date: 06/17/2024   PCP: Sadie Manna, MD  REFERRING PROVIDER: Lane Arthea FORBES, MD  END OF SESSION:  PT End of Session - 06/17/24 1021     Visit Number 7    Number of Visits 24    Date for Recertification  08/12/24    Progress Note Due on Visit 10    PT Start Time 1020    PT Stop Time 1100    PT Time Calculation (min) 40 min    Equipment Utilized During Treatment Gait belt    Activity Tolerance Patient tolerated treatment well    Behavior During Therapy WFL for tasks assessed/performed            Past Medical History:  Diagnosis Date   BPH (benign prostatic hyperplasia)    Difficulty balancing    History of kidney stones    History of stroke    Hypertension    Left arm weakness    Neuropathy    Sensory ataxia    Past Surgical History:  Procedure Laterality Date   CATARACT EXTRACTION W/PHACO Right 12/31/2023   Procedure: PHACOEMULSIFICATION, CATARACT, WITH IOL  11.29 01:03.5;  Surgeon: Myrna Adine Anes, MD;  Location: [facility];  Service: Ophthalmology;  Laterality: Right;   CATARACT EXTRACTION W/PHACO Left 02/04/2024   Procedure: PHACOEMULSIFICATION, CATARACT, WITH IOL INSERTION 7.28, 00:43.6;  Surgeon: Myrna Adine Anes, MD;  Location: [facility];  Service: Ophthalmology;  Laterality: Left;   HERNIA REPAIR Right    HERNIA REPAIR Left    HERNIA REPAIR     umbilical   kidney stone removal      x2   PROSTATE BIOPSY N/A 04/22/2020   Procedure: PROSTATE BIOPSY GRAYCE;  Surgeon: Kassie Ozell SAUNDERS, MD;  Location: [facility];  Service: Urology;  Laterality: N/A;   TONSILLECTOMY     There are no active problems to display for this patient.   ONSET DATE: >6 months  REFERRING DIAG: Per recent progress note: Leg weakness and imbalance  THERAPY DIAG:  Muscle weakness (generalized)  Other lack of  coordination  Other abnormalities of gait and mobility  Difficulty in walking, not elsewhere classified  Unsteadiness on feet  Rationale for Evaluation and Treatment: Rehabilitation  SUBJECTIVE:                                                                                                                                                                                             SUBJECTIVE STATEMENT: From Today: He says he is feeling alright this  morning, just wobbly as usual. Reports his wife says he is walking better. Reports he is still seeing OT for UE. Reports modifications to sleeping environment to decrease risk of falls, by lowering bed and attaching bed ladder and bed rails. Accompanied by son, James Herrera.  From EVAL: Pt reports wife suggested he return to therapy. Pt reports after last bout of therapy, he had reached his limit of improvement, but decided he wanted to try again after his balance has declined since then. Pt reports he has trouble holding things in his hand, and that grasping is difficult. Pt reports his L arm and hand have been the biggest problem for him. Pt reports he notices that when he crosses a line on the floor he has visual issues where he has to be careful to prevent LOB. Pt reports 5  falls within the last 2 years due to carelessness and dizziness. Reports his tendency when falling is backwards. Reports issues with dressing and putting pants on, and that he uses pull on pants.  Pt accompanied by: wife  PERTINENT HISTORY: Per recent progress note by Sherran Milon Berliner, PA: LEFT ARM WEAKNESS/ BALANCE DIFFICULTY/ HISTORY OF STROKE / SENSORY ATAXIA/ BACK PAIN   Ongoing  Patient with past medical history significant for stroke presents for follow-up accordingly, also follow-up of neuropathy and tremor.  Since last office visit he has self tapered off of gabapentin to 400 mg nightly.  His primary concern today is ongoing bilateral hand tremor, worse in his left  and with action or increased stress.  He also notes imbalance.  Referral to physical therapy for leg weakness and imbalance  PAIN:  Are you having pain? Yes: NPRS scale: 4-5/10 Pain location: L hand and UE Pain description: tight, aching Aggravating factors: none Relieving factors: sometimes 1 aspirin and NSAIDs  PRECAUTIONS: Fall  RED FLAGS: None   WEIGHT BEARING RESTRICTIONS: No  FALLS: Has patient fallen in last 6 months? Yes. Number of falls 5 and within last 2 years, noting mostly in the backwards direction, or when stepping over lines.  LIVING ENVIRONMENT: Lives with: lives with their spouse Lives in: House/apartment Stairs: 2+2 steps, railing and grab bars on both sides Has following equipment at home: Single point cane and Quad cane small base  PLOF: Requires assistive device for independence, Needs assistance with ADLs, Needs assistance with homemaking, Needs assistance with gait, Needs assistance with transfers, and wife reports assisting him with most tasks, but he could do them himself if he needed to  PATIENT GOALS: Improve balance, work on improving the L UE and hand  OBJECTIVE:  Note: Objective measures were completed at Evaluation unless otherwise noted.  DIAGNOSTIC FINDINGS:  MRI 01/2022: IMPRESSION: 1. No specific or reversible cause for symptoms. 2. Brain atrophy and chronic small vessel ischemia without notable progression from 2022.    MRI 05/2021: IMPRESSION: No evidence of acute intracranial abnormality.   Small chronic cortical infarct within the high posterior left frontal lobe.   Small chronic lacunar infarcts within the bilateral basal ganglia.   Mild chronic small vessel ischemic changes within the cerebral white matter and pons.   Mild-to-moderate generalized cerebral atrophy. Comparatively mild cerebellar atrophy.   Minimal bilateral ethmoid and right maxillary sinus mucosal thickening.    COGNITION: Overall cognitive status:  Within functional limits for tasks assessed   SENSATION: Light touch: Impaired  and L upper thigh pt reports no sensation   COORDINATION: RAMPs: L side impaired, jerky and slower compared to R side H<>Shin:  WNL BLE F<>N: impaired L side, extended time taken to initially raise arm and find finger and chin  POSTURE: rounded shoulders, forward head, and increased thoracic kyphosis  LOWER EXTREMITY ROM:     Active  Right Eval Left Eval  Hip flexion [facility]  Hip extension    Hip abduction    Hip adduction    Hip internal rotation    Hip external rotation    Knee flexion [facility] WFL  Knee extension [facility]  Ankle dorsiflexion [facility]  Ankle plantarflexion    Ankle inversion    Ankle eversion     (Blank rows = not tested)  LOWER EXTREMITY MMT:    MMT Right Eval Left Eval  Hip flexion 5 5  Hip extension    Hip abduction    Hip adduction    Hip internal rotation    Hip external rotation    Knee flexion 5 5  Knee extension 5 5  Ankle dorsiflexion 4+ 4+  Ankle plantarflexion    Ankle inversion    Ankle eversion    (Blank rows = not tested)  UPPER EXTREMITY MMT:  MMT Right eval Left eval  Shoulder flexion 5 4  Shoulder extension    Shoulder abduction    Shoulder adduction    Shoulder extension    Shoulder internal rotation    Shoulder external rotation    Elbow flexion 5 4+  Elbow extension 5 4+  Wrist flexion    Wrist extension    Wrist ulnar deviation    Wrist radial deviation    Wrist pronation    Wrist supination     (Blank rows = not tested)   BED MOBILITY:  Not tested  TRANSFERS: Sit to stand: CGA and Min A  Assistive device utilized: Corporate treasurer base and arm rest     Stand to sit: CGA  Assistive device utilized: Counselling psychologist and arm rest     Chair to chair: CGA  Assistive device utilized: Counselling psychologist and arm rest       STAIRS: Not tested GAIT: Findings: Gait Characteristics: decreased arm swing- Right, decreased arm swing-  Left, decreased step length- Right, decreased step length- Left, decreased stride length, decreased hip/knee flexion- Right, and decreased hip/knee flexion- Left, Distance walked: 53m, Assistive device utilized:Quad cane small base and None, Level of assistance: CGA and Min A, and Comments: Shuffling pattern upon gait initiation, pt reports freezing of gait when steeping over lines and changed surfaces, not visualized during this assessment   FUNCTIONAL TESTS:  -Pt performed 5 time sit<>stand (5xSTS): 16.62 sec (>15 sec indicates increased fall risk)  -10 Meter Walk Test: Patient instructed to walk 10 meters (32.8 ft) as quickly and as safely as possible at their normal speed x2 and at a fast speed x2. Time measured from 2 meter mark to 8 meter mark to accommodate ramp-up and ramp-down.  Normal speed 1: 0.432 m/s Normal speed 2: 0.446 m/s Average Normal speed: 0.439 m/s Cut off scores: <0.4 m/s = household Ambulator, 0.4-0.8 m/s = limited community Ambulator, >0.8 m/s = community Ambulator, >1.2 m/s = crossing a street, <1.0 = increased fall risk MCID 0.05 m/s (small), 0.13 m/s (moderate), 0.06 m/s (significant)  (ANPTA Core Set of Outcome Measures for Adults with Neurologic Conditions, 2018)   -PT instructed pt in TUG: 21.16s without cane, 25.65s with cane sec (average of 3 trials; >13.5 sec indicates increased fall risk)   PATIENT SURVEYS:  The  Activities-Specific Balance Confidence (ABC) Scale 0% 10 20 30  40 50 60 70 80 90 100% No confidence<->completely confident  "How confident are you that you will not lose your balance or become unsteady when you . . .   Date tested 05/23/2024  Walk around the house 80%  2. Walk up or down stairs 60%  3. Bend over and pick up a slipper from in front of a closet floor 30%  4. Reach for a small can off a shelf at eye level 100%  5. Stand on tip toes and reach for something above your head 20%  6. Stand on a chair and reach for something 0%  7. Sweep  the floor 20%  8. Walk outside the house to a car parked in the driveway 80%  9. Get into or out of a car 40%  10. Walk across a parking lot to the mall 10%  11. Walk up or down a ramp 40%  12. Walk in a crowded mall where people rapidly walk past you 50%  13. Are bumped into by people as you walk through the mall 10%  14. Step onto or off of an escalator while you are holding onto the railing 30%  15. Step onto or off an escalator while holding onto parcels such that you cannot hold onto the railing 0%  16. Walk outside on icy sidewalks 0%  Total: #/16 35.6                                                                                                                                   TREATMENT DATE: 06/13/2024  Sit <> stands from chair holding 3 kg yellow ball in R UE x8, no ball x8, min assist for full stand a few reps  step up onto 4in step, 2# AW on B LE, x10 each side, B UE support, CGA from SPT  side step onto 4in step, 2# AW on B LE, x10 each side, B UE support, CGA from SPT, more difficulty seen and reported with L side  NBOS on blue foam pad, 2x30 EO, CGA-min assist, 2x30 EC, mod-max assist with no UE support EC, L lateral lean seen during activity  ambulation 461ft, no AD use after first 172ft, verbal cues for decreased cadence and increased step length for decreased shuffling pattern, CGA from SPT  dynamic balance course, 2 laps; step over 3 half foam rollers, onto blue mat, onto foam pad; CGA + hand held assist from SPT  PATIENT EDUCATION: Education details: Pt educated on possibility of benefit on OT for L UE and hand therapy, educated on obtaining referral from MD for OT, interpretation of outcome measures Person educated: Patient and Spouse Education method: Explanation Education comprehension: verbalized understanding  HOME EXERCISE PROGRAM: Provide next session.  GOALS: Goals reviewed with patient? No  SHORT TERM GOALS: Target date: 06/17/2024  Patient  will be independent with home exercise program to  improve strength/mobility for increased functional independence with ADLs and mobility. Baseline: Goal status: INITIAL  SHORT TERM GOALS: Target date: 06/17/2024    Patient will be independent in home exercise program to improve strength/mobility for better functional independence with ADLs. Baseline: Goal status: INITIAL   LONG TERM GOALS: Target date: 08/12/2024    Patient will increase ABC scale score >80% to demonstrate better functional mobility and better confidence with ADLs.  Baseline:  Goal status: INITIAL  2.  Patient (> 85 years old) will complete five times sit to stand test in < 15 seconds indicating an increased LE strength and improved balance. Baseline: 16.62s Goal status: INITIAL  3.  Patient will increase Berg Balance score by > 6 points to demonstrate decreased fall risk during functional activities Baseline: 11/14= 42/56 Goal status: INITIAL  4.  Patient will increase 10 meter walk test to >1.68m/s as to improve gait speed for better community ambulation and to reduce fall risk. Baseline: 13.89 (0.487m/s), 13.46 (0.449m/s) AVG: 0.439m/s Goal status: INITIAL  5.  Patient will reduce timed up and go to <11 seconds to reduce fall risk and demonstrate improved transfer/gait ability. Baseline: 21.16s without cane, 25.65s with cane Goal status: INITIAL  6.  Patient will increase FGA by > 7 points as to demonstrate reduced fall risk and improved dynamic gait balance for better safety with community/home ambulation.   Baseline: 11/14= 7/30 Goal status: REVISED    ASSESSMENT:  CLINICAL IMPRESSION:  Pt arrived to session today with his son, motivated for activity. Pt required min assist for a few sit <> stand transitions, needing cuing for increased forward lean and proper foot placement. Pt tolerated increased weight of LE today using 2# AW, reporting that it did not feel overly challenging following activities. Pt  still showing difficulty with eyes closed foam pad balance activities, needing up to mod assist for upright posture to prevent LoB and L lateral lean. responded well to verbal cues for increased step length and decreased cadence during ambulation, showing improved pattern of gait and decreased shuffling without use of SPC, reporting he walks a lot around the house without an AD. Pt showed no LoB balance today during dynamic balance course, needing hand held assist from SPT and occasional cue for increased hip/knee flexion for foot clearance during step over foam rollers. Pot reported he felt more confident today than last session, reporting satisfaction with performance during dynamic gait course. Pt will continue to benefit from skilled therapy to address remaining deficits in order to improve overall QoL and return to PLOF.   OBJECTIVE IMPAIRMENTS: Abnormal gait, decreased activity tolerance, decreased balance, decreased coordination, decreased endurance, decreased mobility, difficulty walking, decreased ROM, decreased safety awareness, dizziness, hypomobility, increased fascial restrictions, impaired perceived functional ability, increased muscle spasms, impaired flexibility, impaired sensation, improper body mechanics, and postural dysfunction.   ACTIVITY LIMITATIONS: carrying, lifting, bending, squatting, stairs, transfers, bathing, toileting, dressing, hygiene/grooming, locomotion level, and caring for others  PARTICIPATION LIMITATIONS: cleaning, laundry, driving, shopping, and yard work  PERSONAL FACTORS: Age, Education, Time since onset of injury/illness/exacerbation, and 3+ comorbidities: stroke, HTN, BPH are also affecting patient's functional outcome.   REHAB POTENTIAL: Good  CLINICAL DECISION MAKING: Evolving/moderate complexity  EVALUATION COMPLEXITY: Moderate  PLAN:  PT FREQUENCY: 2x/week  PT DURATION: 12 weeks  PLANNED INTERVENTIONS: 97164- PT Re-evaluation, 97750- Physical  Performance Testing, 97110-Therapeutic exercises, 97530- Therapeutic activity, [id number]- Neuromuscular re-education, 97535- Self Care, [id number]- Manual therapy, [id number]- Gait training, [phone]- Aquatic Therapy, [id number]- Electrical stimulation (unattended), [phone] (1-2 muscles),  [id number] (3+ muscles)- Dry Needling, Patient/Family education, Balance training, Stair training, Joint mobilization, Joint manipulation, Spinal mobilization, Vestibular training, Visual/preceptual remediation/compensation, DME instructions, Wheelchair mobility training, Cryotherapy, and Moist heat  PLAN FOR NEXT SESSION:  -Continue working to improve dynamic balance and gait with obstacles such allowing him to cross thresholds and uneven surfaces with less difficulty.   Renna Helling, [place] 10:23 AM 06/17/24

## 2024-06-19 ENCOUNTER — Ambulatory Visit: Admitting: Physical Therapy

## 2024-06-20 ENCOUNTER — Ambulatory Visit

## 2024-06-20 DIAGNOSIS — R262 Difficulty in walking, not elsewhere classified: Secondary | ICD-10-CM

## 2024-06-20 DIAGNOSIS — R2689 Other abnormalities of gait and mobility: Secondary | ICD-10-CM | POA: Diagnosis not present

## 2024-06-20 DIAGNOSIS — R2681 Unsteadiness on feet: Secondary | ICD-10-CM

## 2024-06-20 DIAGNOSIS — M6281 Muscle weakness (generalized): Secondary | ICD-10-CM

## 2024-06-20 DIAGNOSIS — R278 Other lack of coordination: Secondary | ICD-10-CM

## 2024-06-20 NOTE — Therapy (Signed)
 OUTPATIENT PHYSICAL THERAPY TREATMENT   Patient Name: James Herrera. MRN: [id number] DOB:Oct 09, 1933, 89 y.o., male Today's Date: 06/20/2024  PCP: Sadie Manna, MD  REFERRING PROVIDER: Lane Arthea FORBES, MD  END OF SESSION:  PT End of Session - 06/20/24 0859     Visit Number 8    Number of Visits 24    Date for Recertification  08/12/24    Authorization Type Medicare & Tricare    Progress Note Due on Visit 10    PT Start Time 0855    PT Stop Time 0930    PT Time Calculation (min) 35 min    Equipment Utilized During Treatment Gait belt    Activity Tolerance Patient tolerated treatment well;No increased pain    Behavior During Therapy WFL for tasks assessed/performed          Past Medical History:  Diagnosis Date   BPH (benign prostatic hyperplasia)    Difficulty balancing    History of kidney stones    History of stroke    Hypertension    Left arm weakness    Neuropathy    Sensory ataxia    Past Surgical History:  Procedure Laterality Date   CATARACT EXTRACTION W/PHACO Right 12/31/2023   Procedure: PHACOEMULSIFICATION, CATARACT, WITH IOL  11.29 01:03.5;  Surgeon: Myrna Adine Anes, MD;  Location: [facility];  Service: Ophthalmology;  Laterality: Right;   CATARACT EXTRACTION W/PHACO Left 02/04/2024   Procedure: PHACOEMULSIFICATION, CATARACT, WITH IOL INSERTION 7.28, 00:43.6;  Surgeon: Myrna Adine Anes, MD;  Location: [facility];  Service: Ophthalmology;  Laterality: Left;   HERNIA REPAIR Right    HERNIA REPAIR Left    HERNIA REPAIR     umbilical   kidney stone removal      x2   PROSTATE BIOPSY N/A 04/22/2020   Procedure: PROSTATE BIOPSY GRAYCE;  Surgeon: Kassie Ozell SAUNDERS, MD;  Location: [facility];  Service: Urology;  Laterality: N/A;   TONSILLECTOMY     There are no active problems to display for this patient.  ONSET DATE: >6 months  REFERRING DIAG: Per recent progress note: Leg weakness and imbalance  THERAPY DIAG:   Muscle weakness (generalized)  Other lack of coordination  Other abnormalities of gait and mobility  Difficulty in walking, not elsewhere classified  Unsteadiness on feet  Rationale for Evaluation and Treatment: Rehabilitation  SUBJECTIVE:                                                                                                                                                                                             SUBJECTIVE STATEMENT: Pt reports HEP performance,  no recent falls, no medical updates since last PT session.   PERTINENT HISTORY:  Pt reports wife suggested he return to therapy. Pt reports after last bout of therapy, he had reached his limit of improvement, but decided he wanted to try again after his balance has declined since then. Pt reports he has trouble holding things in his hand, and that grasping is difficult. Pt reports his L arm and hand have been the biggest problem for him. Pt reports he notices that when he crosses a line on the floor he has visual issues where he has to be careful to prevent LOB. Pt reports 5  falls within the last 2 years due to carelessness and dizziness. Reports his tendency when falling is backwards. Reports issues with dressing and putting pants on, and that he uses pull on pants.  PAIN:  Are you having pain? No   PRECAUTIONS: Fall  WEIGHT BEARING RESTRICTIONS: No  FALLS: Has patient fallen in last 6 months? Yes. Number of falls 5 and within last 2 years, noting mostly in the backwards direction, or when stepping over lines.  LIVING ENVIRONMENT: Lives with: lives with their spouse Lives in: House/apartment Stairs: 2+2 steps, railing and grab bars on both sides Has following equipment at home: Single point cane and Quad cane small base  PLOF: Requires assistive device for independence, Needs assistance with ADLs, Needs assistance with homemaking, Needs assistance with gait, Needs assistance with transfers, and wife  reports assisting him with most tasks, but he could do them himself if he needed to  PATIENT GOALS: Improve balance, work on improving the L UE and hand  OBJECTIVE:  Note: Objective measures were completed at Evaluation unless otherwise noted.  LOWER EXTREMITY MMT:    MMT Right Eval Left Eval  Hip flexion 5 5  Knee flexion 5 5  Knee extension 5 5  Ankle dorsiflexion 4+ 4+  (Blank rows = not tested)  UPPER EXTREMITY MMT:  MMT Right eval Left eval  Shoulder flexion 5 4  Elbow flexion 5 4+  Elbow extension 5 4+   (Blank rows = not tested)   FUNCTIONAL TESTS:  -Pt performed 5 time sit<>stand (5xSTS): 16.62 sec (>15 sec indicates increased fall risk)  -10 Meter Walk Test: Normal speed 1: 0.432 m/s Normal speed 2: 0.446 m/s Average Normal speed: 0.439 m/s  -PT instructed pt in TUG: 21.16s without cane, 25.65s with cane sec (average of 3 trials; >13.5 sec indicates increased fall risk)   PATIENT SURVEYS:  The Activities-Specific Balance Confidence (ABC) Scale: 35.6% 05/23/24                                                                                                                              TREATMENT DATE 06/20/2024:   -STS from chair x10, hands free (3 LOB backwards) -STS from chair x8, hands  free, cued to move slow, remain upright 2-3sec for balance) -469ft AMB overground, no device 23m25s (alterantes  eyes closed, open x 2sec)  -STS from chair x12 RUE pull to stand ( emphasis on reps, not balance)  - STS from chair (feet on 4 step )  x6 (pull to stand)  -obstacle balance course, loop around outer // bar Clockwise, red mat, 2 foam pads, 4 firm step; 1/2 roll step over  PATIENT EDUCATION: Education details: Pt educated on possibility of benefit on OT for L UE and hand therapy, educated on obtaining referral from MD for OT, interpretation of outcome measures Person educated: Patient and Spouse Education method: Explanation Education comprehension: verbalized  understanding  HOME EXERCISE PROGRAM: Provide next session.  GOALS: Goals reviewed with patient? No SHORT TERM GOALS: Target date: 06/17/2024   Patient will be independent in home exercise program to improve strength/mobility for better functional independence with ADLs. Baseline: Goal status: INITIAL  LONG TERM GOALS: Target date: 08/12/2024   Patient will increase ABC scale score >80% to demonstrate better functional mobility and better confidence with ADLs.  Baseline:  Goal status: INITIAL  2.  Patient (> 48 years old) will complete five times sit to stand test in < 15 seconds indicating an increased LE strength and improved balance. Baseline: 16.62s Goal status: INITIAL  3.  Patient will increase Berg Balance score by > 6 points to demonstrate decreased fall risk during functional activities Baseline: 11/14= 42/56 Goal status: INITIAL  4.  Patient will increase 10 meter walk test to >1.68m/s as to improve gait speed for better community ambulation and to reduce fall risk. Baseline: 13.89 (0.433m/s), 13.46 (0.466m/s) AVG: 0.439m/s Goal status: INITIAL  5.  Patient will reduce timed up and go to <11 seconds to reduce fall risk and demonstrate improved transfer/gait ability. Baseline: 21.16s without cane, 25.65s with cane Goal status: INITIAL  6.  Patient will increase FGA by > 7 points as to demonstrate reduced fall risk and improved dynamic gait balance for better safety with community/home ambulation.   Baseline: 11/14= 7/30 Goal status: REVISED   ASSESSMENT:  CLINICAL IMPRESSION: Continued with transfer training, limited by both hip/ankle strength impairment and unsteadiness, may benefit more by separating these two problems into separate interventions in future. Pt takes sit breaks as needed, we call for a transport chair at end of session due to leg being fatigued.  Pt will continue to benefit from skilled therapy to address remaining deficits in order to improve overall  QoL and return to PLOF.   OBJECTIVE IMPAIRMENTS: Abnormal gait, decreased activity tolerance, decreased balance, decreased coordination, decreased endurance, decreased mobility, difficulty walking, decreased ROM, decreased safety awareness, dizziness, hypomobility, increased fascial restrictions, impaired perceived functional ability, increased muscle spasms, impaired flexibility, impaired sensation, improper body mechanics, and postural dysfunction.   ACTIVITY LIMITATIONS: carrying, lifting, bending, squatting, stairs, transfers, bathing, toileting, dressing, hygiene/grooming, locomotion level, and caring for others  PARTICIPATION LIMITATIONS: cleaning, laundry, driving, shopping, and yard work  PERSONAL FACTORS: Age, Education, Time since onset of injury/illness/exacerbation, and 3+ comorbidities: stroke, HTN, BPH are also affecting patient's functional outcome.   REHAB POTENTIAL: Good  CLINICAL DECISION MAKING: Evolving/moderate complexity  EVALUATION COMPLEXITY: Moderate  PLAN:  PT FREQUENCY: 2x/week  PT DURATION: 12 weeks  PLANNED INTERVENTIONS: 97164- PT Re-evaluation, 97750- Physical Performance Testing, 97110-Therapeutic exercises, 97530- Therapeutic activity, 97112- Neuromuscular re-education, 97535- Self Care, 02859- Manual therapy, [phone]- Gait training, [phone]- Aquatic Therapy, [phone]- Electrical stimulation (unattended), [phone] (1-2 muscles), [phone] (3+ muscles)- Dry Needling, Patient/Family education, Balance training, Stair training, Joint mobilization, Joint manipulation, Spinal mobilization, Vestibular training, Visual/preceptual remediation/compensation, DME instructions,  Wheelchair mobility training, Cryotherapy, and Moist heat  PLAN FOR NEXT SESSION:  -Continue working to improve dynamic balance and gait with obstacles such allowing him to cross thresholds and uneven surfaces with less difficulty.   9:03 AM, 06/20/2024 Peggye JAYSON Linear, PT, DPT Physical Therapist - [facility]  Outpatient Physical Therapy- [facility] [phone]

## 2024-06-24 ENCOUNTER — Ambulatory Visit: Admitting: Occupational Therapy

## 2024-06-24 ENCOUNTER — Encounter: Payer: Self-pay | Admitting: Physical Therapy

## 2024-06-24 ENCOUNTER — Ambulatory Visit: Admitting: Physical Therapy

## 2024-06-24 DIAGNOSIS — M6281 Muscle weakness (generalized): Secondary | ICD-10-CM

## 2024-06-24 DIAGNOSIS — R278 Other lack of coordination: Secondary | ICD-10-CM

## 2024-06-24 DIAGNOSIS — R2689 Other abnormalities of gait and mobility: Secondary | ICD-10-CM | POA: Diagnosis not present

## 2024-06-24 DIAGNOSIS — R262 Difficulty in walking, not elsewhere classified: Secondary | ICD-10-CM

## 2024-06-24 DIAGNOSIS — R2681 Unsteadiness on feet: Secondary | ICD-10-CM

## 2024-06-24 NOTE — Therapy (Signed)
 OUTPATIENT OCCUPATIONAL THERAPY NEURO TREATMENT NOTE  Patient Name: James Herrera. MRN: [id number] DOB:1933/10/20, 89 y.o., male Today's Date: 06/24/2024  PCP: Dr. Tamra Leventhal REFERRING PROVIDER: Dr. Arthea Farrow  END OF SESSION:  OT End of Session - 06/24/24 2146     Visit Number 2    Number of Visits 24    Date for Recertification  09/02/24    OT Start Time 1100    OT Stop Time 1145    OT Time Calculation (min) 45 min    Activity Tolerance Patient tolerated treatment well    Behavior During Therapy [facility] for tasks assessed/performed         Past Medical History:  Diagnosis Date   BPH (benign prostatic hyperplasia)    Difficulty balancing    History of kidney stones    History of stroke    Hypertension    Left arm weakness    Neuropathy    Sensory ataxia    Past Surgical History:  Procedure Laterality Date   CATARACT EXTRACTION W/PHACO Right 12/31/2023   Procedure: PHACOEMULSIFICATION, CATARACT, WITH IOL  11.29 01:03.5;  Surgeon: Myrna Adine Anes, MD;  Location: [facility];  Service: Ophthalmology;  Laterality: Right;   CATARACT EXTRACTION W/PHACO Left 02/04/2024   Procedure: PHACOEMULSIFICATION, CATARACT, WITH IOL INSERTION 7.28, 00:43.6;  Surgeon: Myrna Adine Anes, MD;  Location: [facility];  Service: Ophthalmology;  Laterality: Left;   HERNIA REPAIR Right    HERNIA REPAIR Left    HERNIA REPAIR     umbilical   kidney stone removal      x2   PROSTATE BIOPSY N/A 04/22/2020   Procedure: PROSTATE BIOPSY GRAYCE;  Surgeon: Kassie Ozell SAUNDERS, MD;  Location: [facility];  Service: Urology;  Laterality: N/A;   TONSILLECTOMY     There are no active problems to display for this patient.  ONSET DATE: Late 2020 for LUE weakness and hand tremor following covid shot; L hand has worsened further in the last 1.5 years.   REFERRING DIAG: R27.8 (ICD-10-CM) - Other lack of coordination   THERAPY DIAG:  Muscle weakness (generalized)  Rationale  for Evaluation and Treatment: Rehabilitation  SUBJECTIVE:   SUBJECTIVE STATEMENT:  Pt. arrived to the therapy session using walker. Pt. required the use of a transport chair 2/2 fatigue following OT today. Pt accompanied by: family member: Son  PERTINENT HISTORY: Per pt, onset of LUE weakness and tremor was post covid vaccine in late 2020.  Pt denies much decline in the L hand recently, but daughter endorses pt's L hand function has further worsened over the last 1.5 years.   Per OT eval note in 11/23: Patient presents with history of numbness and tingling in the left hand , small chronic cortical infarcts within high bilateral basal ganglia, mild chronic small vessel ischemic changes within the white matter/pons.  Patient reports left upper extremity changes occurred after receiving the COVID shot in 01/27/2020   PRECAUTIONS: Fall  WEIGHT BEARING RESTRICTIONS: No  PAIN:  Are you having pain? Yes: NPRS scale: 0 at eval, but pt experiences brief bouts of mod-severe pain Pain location: L arm or hand  Pain description: shooting pain that comes and goes, and a little arthritis in both thumbs  Aggravating factors: N/A Relieving factors: N/A  FALLS: Has patient fallen in last 6 months? Yes. Number of falls 5-6  LIVING ENVIRONMENT: Lives with: lives with their spouse Lives in: Other 1 level home Stairs: 2 steps from car port Has following equipment  at home: Quad cane large base, new bed rail, grab bars all over the house and within bathroom  PLOF: Independent with basic ADLs prior to 2020  PATIENT GOALS: I'd like to be able to hold things with my L arm.   OBJECTIVE:  Note: Objective measures were completed at Evaluation unless otherwise noted.  HAND DOMINANCE: Left  ADLs: Overall ADLs: Pt endorses using R non-dominant arm for daily tasks; pt reports he can hardly do anything bimanually  Transfers/ambulation related to ADLs: uses wide base quad cane, has a lift chair, otherwise  performs basic transfers with supv-modified indep Eating: eats with R hand only; assist to cut food  Grooming: R hand only UB Dressing: unable to button LB Dressing: assist with socks; extra time for pants, wears slip on shoes and elastic waist pants Toileting: modified indep with R non-dominant hand  Bathing: assist with drying back, otherwise modified indep with washing Tub Shower transfers: modified indep Equipment: Grab bars and Walk in shower, shower chair, grab bars throughout the bathroom and house  IADLs: No longer driving, 1 daughter lives in [place] 15 min away that can check in as needed, son also 10 min away and assists as needed  Shopping: reliant on family  Light housekeeping: pt manages most of the laundry with modified indep, paid house cleaner 1x a week  Meal Prep: shared task between pt and daughter Community mobility: uses large base quad cane Medication management: indep with weekly pill Retail Buyer: spouse manages  Handwriting: 100% legible, Increased time, and using R non-dominant hand; assist required to set up pen in R hand  MOBILITY STATUS: Hx of falls  POSTURE COMMENTS:  rounded shoulders, forward head, and increased thoracic kyphosis  ACTIVITY TOLERANCE: Activity tolerance: To be further assessed within functional contexts  FUNCTIONAL OUTCOME MEASURES: MAM-20 for neurological conditions: TBD  06/24/24: MAM-20 for neurological conditions: 57/80  UPPER EXTREMITY ROM:  Bilat shoulders grossly WFL  Active ROM Right eval Left eval  Shoulder flexion    Shoulder abduction    Shoulder adduction    Shoulder extension    Shoulder internal rotation    Shoulder external rotation    Elbow flexion    Elbow extension    Wrist flexion    Wrist extension    Wrist ulnar deviation    Wrist radial deviation    Wrist pronation    Wrist supination    (Blank rows = not tested)  UPPER EXTREMITY MMT: (will complete in follow up session)  MMT  Right eval Left eval  Shoulder flexion    Shoulder abduction    Shoulder adduction    Shoulder extension    Shoulder internal rotation    Shoulder external rotation    Middle trapezius    Lower trapezius    Elbow flexion    Elbow extension    Wrist flexion    Wrist extension    Wrist ulnar deviation    Wrist radial deviation    Wrist pronation    Wrist supination    (Blank rows = not tested)  HAND FUNCTION: Grip strength: Right: 70 lbs; Left: 8 lbs, Lateral pinch: Right: 13 lbs, Left: thumb slips  lbs, and 3 point pinch: Right: 7 lbs, 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   COORDINATION: Finger Nose Finger test: L severely ataxic and tremulous  9 Hole Peg test: Right: 59 sec; Left: unable  sec (able to remove 1 pre-placed peg with extra time)  SENSATION: WFL  EDEMA: No visible edema  MUSCLE TONE: LUE: Within functional limits  COGNITION: Overall cognitive status: daughter helps to provide accurate timelines for functional decline in L hand  VISION: Pt reports vision has improved since cataract sx  PERCEPTION: Not tested  PRAXIS: Impaired: Motor planning; pt describes difficulty releasing items from L hand   OBSERVATIONS:  Pt pleasant and cooperative and receptive to OT poc. 06/10/24: Observed pill-rolling tremor in L thumb and IF at rest while R hand performed 9 hole peg test.                                                                                        TREATMENT DATE: 06/24/24  Self Care:  -MAM-20 for neurological conditions was administered. See above for scoring details.  Therapeutic Activities.:   -Facilitated Theraputty left hand strengthening exercises including: gross gripping, lateral, and 3pt. pinch strengthening, digit extension log rolling at the tabletop, and [facility] skills manipulating the putty to retrieve small items from the putty. Pt. was provided with a visual  handout HEP through [employer] with a video access code.   Manual Theraputty:  -Gentle carpal, and metacarpal spread stretches were performed to the left hand, as well as stretches to the thenar, and hypothenar musculature. Manual therapy was performed independent of, and in preparation for therapeutic Activities.      PATIENT EDUCATION: Education details: Geneticist, molecular exercises, MAM-20 Person educated: Patient and Child(ren) Education method: Explanation and Verbal cues Education comprehension: verbalized understanding  HOME EXERCISE PROGRAM:  HEP with yellow theraputty -- Of note, Per volunteer services, Pt./caregiver left his theraputty, HEP, and schedule calendar in the transport chair cart at the front entrance of the hospital today  GOALS: Goals reviewed with patient? Yes  SHORT TERM GOALS: Target date: 07/22/24  Pt will be indep to perform HEP for improving LUE strength and coordination for daily tasks. Baseline: Eval: Not yet initiated Goal status: INITIAL  LONG TERM GOALS: Target date: 09/02/24  Pt will increase MAM-20 score for neurological conditions by (TBD) or more points to indicate improvement in self perceived functional use of the L hand for daily tasks.  Baseline: Eval: TBD Goal status: INITIAL  2.  Pt will increase L grip strength by 10 or more lbs to enable pt to transport light ADL supplies within the home in L dominant hand.  Baseline: Eval: L 8 lbs, R 70 lbs Goal status: INITIAL  3.  Pt will improve L hand coordination skills to be able to cut food with modified indep.   Baseline: Eval: Assist to cut food. Goal status: INITIAL  4.  Pt will verbalize and demonstrate 2-3 compensatory strategies to manage LUE tremors for improved efficiency and indep with ADL/IADL tasks.  Baseline: Eval: Education not yet initiated. Goal status: INITIAL  ASSESSMENT:  CLINICAL IMPRESSION:  Pt. reports no pain today, however did report fatigue, and required the  use of a transport chair at the end of the session. Pt. tolerated the manual therapy stretches to the left hand, with less tightness noted following. Pt. required tactile cuing, and  cues for visual demonstration for the right hand for each of the theraputty hand strengthening exercises 2/2 decreased ROM, and impaired motor control/coordination. Pt. continues to present with left hand ataxia with  decreased motor control, and coordination. Pt  continues to benefit from skilled OT to address above noted deficits, working towards pt's specific goal of being able to carry items in the L dominant hand.   PERFORMANCE DEFICITS: in functional skills including ADLs, IADLs, coordination, dexterity, sensation, strength, pain, flexibility, Fine motor control, Gross motor control, mobility, balance, body mechanics, decreased knowledge of precautions, decreased knowledge of use of DME, and UE functional use, cognitive skills including memory, and psychosocial skills including coping strategies, environmental adaptation, habits, and routines and behaviors.   IMPAIRMENTS: are limiting patient from ADLs, IADLs, leisure, and social participation.   CO-MORBIDITIES: has co-morbidities such as impaired balance, neuropathy that affects occupational performance. Patient will benefit from skilled OT to address above impairments and improve overall function.  MODIFICATION OR ASSISTANCE TO COMPLETE EVALUATION: Min-Moderate modification of tasks or assist with assess necessary to complete an evaluation.  OT OCCUPATIONAL PROFILE AND HISTORY: Detailed assessment: Review of records and additional review of physical, cognitive, psychosocial history related to current functional performance.  CLINICAL DECISION MAKING: Moderate - several treatment options, min-mod task modification necessary  REHAB POTENTIAL: Good for established goals  EVALUATION COMPLEXITY: Moderate    PLAN:  OT FREQUENCY: 2x/week  OT DURATION: 12  weeks  PLANNED INTERVENTIONS: 97168 OT Re-evaluation, 97535 self care/ADL training, 02889 therapeutic exercise, 97530 therapeutic activity, 97112 neuromuscular re-education, 97140 manual therapy, 97010 moist heat, 97010 cryotherapy, 97750 Physical Performance Testing, balance training, functional mobility training, psychosocial skills training, energy conservation, coping strategies training, patient/family education, and DME and/or AE instructions  RECOMMENDED OTHER SERVICES: none at this time; multi-discipline order for PT in place  CONSULTED AND AGREED WITH PLAN OF CARE: Patient and family member/caregiver  PLAN FOR NEXT SESSION: HEP initiation, MAM-20 for neurological disease, MMT  Richardson Otter, MS, OTR/L  06/24/2024, 9:50 PM

## 2024-06-24 NOTE — Therapy (Signed)
 OUTPATIENT PHYSICAL THERAPY TREATMENT   Patient Name: James Herrera. MRN: [id number] DOB:September 08, 1933, 89 y.o., male Today's Date: 06/24/2024  PCP: Sadie Manna, MD  REFERRING PROVIDER: Lane Arthea FORBES, MD  END OF SESSION:  PT End of Session - 06/24/24 1021     Visit Number 9    Number of Visits 24    Date for Recertification  08/12/24    Authorization Type Medicare & Tricare    Progress Note Due on Visit 10    PT Start Time 1019    PT Stop Time 1100    PT Time Calculation (min) 41 min    Equipment Utilized During Treatment Gait belt    Activity Tolerance Patient tolerated treatment well;No increased pain    Behavior During Therapy WFL for tasks assessed/performed          Past Medical History:  Diagnosis Date   BPH (benign prostatic hyperplasia)    Difficulty balancing    History of kidney stones    History of stroke    Hypertension    Left arm weakness    Neuropathy    Sensory ataxia    Past Surgical History:  Procedure Laterality Date   CATARACT EXTRACTION W/PHACO Right 12/31/2023   Procedure: PHACOEMULSIFICATION, CATARACT, WITH IOL  11.29 01:03.5;  Surgeon: Myrna Adine Anes, MD;  Location: [facility];  Service: Ophthalmology;  Laterality: Right;   CATARACT EXTRACTION W/PHACO Left 02/04/2024   Procedure: PHACOEMULSIFICATION, CATARACT, WITH IOL INSERTION 7.28, 00:43.6;  Surgeon: Myrna Adine Anes, MD;  Location: [facility];  Service: Ophthalmology;  Laterality: Left;   HERNIA REPAIR Right    HERNIA REPAIR Left    HERNIA REPAIR     umbilical   kidney stone removal      x2   PROSTATE BIOPSY N/A 04/22/2020   Procedure: PROSTATE BIOPSY GRAYCE;  Surgeon: Kassie Ozell SAUNDERS, MD;  Location: [facility];  Service: Urology;  Laterality: N/A;   TONSILLECTOMY     There are no active problems to display for this patient.  ONSET DATE: >6 months  REFERRING DIAG: Per recent progress note: Leg weakness and imbalance  THERAPY DIAG:   Muscle weakness (generalized)  Other lack of coordination  Other abnormalities of gait and mobility  Difficulty in walking, not elsewhere classified  Unsteadiness on feet  Rationale for Evaluation and Treatment: Rehabilitation  SUBJECTIVE:                                                                                                                                                                                             SUBJECTIVE STATEMENT: Pt reports feeling wobbly  as usual but feeling a little better since beginning therapy.  PERTINENT HISTORY:  Pt reports wife suggested he return to therapy. Pt reports after last bout of therapy, he had reached his limit of improvement, but decided he wanted to try again after his balance has declined since then. Pt reports he has trouble holding things in his hand, and that grasping is difficult. Pt reports his L arm and hand have been the biggest problem for him. Pt reports he notices that when he crosses a line on the floor he has visual issues where he has to be careful to prevent LOB. Pt reports 5  falls within the last 2 years due to carelessness and dizziness. Reports his tendency when falling is backwards. Reports issues with dressing and putting pants on, and that he uses pull on pants.  PAIN:  Are you having pain? No   PRECAUTIONS: Fall  WEIGHT BEARING RESTRICTIONS: No  FALLS: Has patient fallen in last 6 months? Yes. Number of falls 5 and within last 2 years, noting mostly in the backwards direction, or when stepping over lines.  LIVING ENVIRONMENT: Lives with: lives with their spouse Lives in: House/apartment Stairs: 2+2 steps, railing and grab bars on both sides Has following equipment at home: Single point cane and Quad cane small base  PLOF: Requires assistive device for independence, Needs assistance with ADLs, Needs assistance with homemaking, Needs assistance with gait, Needs assistance with transfers, and wife  reports assisting him with most tasks, but he could do them himself if he needed to  PATIENT GOALS: Improve balance, work on improving the L UE and hand  OBJECTIVE:  Note: Objective measures were completed at Evaluation unless otherwise noted.  LOWER EXTREMITY MMT:    MMT Right Eval Left Eval  Hip flexion 5 5  Knee flexion 5 5  Knee extension 5 5  Ankle dorsiflexion 4+ 4+  (Blank rows = not tested)  UPPER EXTREMITY MMT:  MMT Right eval Left eval  Shoulder flexion 5 4  Elbow flexion 5 4+  Elbow extension 5 4+   (Blank rows = not tested)   FUNCTIONAL TESTS:  -Pt performed 5 time sit<>stand (5xSTS): 16.62 sec (>15 sec indicates increased fall risk)  -10 Meter Walk Test: Normal speed 1: 0.432 m/s Normal speed 2: 0.446 m/s Average Normal speed: 0.439 m/s  -PT instructed pt in TUG: 21.16s without cane, 25.65s with cane sec (average of 3 trials; >13.5 sec indicates increased fall risk)   PATIENT SURVEYS:  The Activities-Specific Balance Confidence (ABC) Scale: 35.6% 05/23/24                                                                                                                              TREATMENT DATE 06/24/2024:   Sit<>stand from chair, no UE support, 3x10 631ft ambulation, using no SPC, CGA assist only 3# AW on B LE, alternating taps onto first step of staircase, 2x20 3 laps, min-mod assist from  SPT, over red mat and two half foam rollers, 2 almost LoB EO NBOS static stance, no UE support, 2x30s, min assisst from SPT EC NBOS static stance, no UE support, 2x30s, mod-max assisst from SPT 370ft ambulating alternating EO/EC, min assist from SPT  PATIENT EDUCATION: Education details: Pt educated on possibility of benefit on OT for L UE and hand therapy, educated on obtaining referral from MD for OT, interpretation of outcome measures Person educated: Patient and Spouse Education method: Explanation Education comprehension: verbalized understanding  HOME  EXERCISE PROGRAM: Provide next session.  GOALS: Goals reviewed with patient? No SHORT TERM GOALS: Target date: 06/17/2024   Patient will be independent in home exercise program to improve strength/mobility for better functional independence with ADLs. Baseline: Goal status: INITIAL  LONG TERM GOALS: Target date: 08/12/2024   Patient will increase ABC scale score >80% to demonstrate better functional mobility and better confidence with ADLs.  Baseline:  Goal status: INITIAL  2.  Patient (> 62 years old) will complete five times sit to stand test in < 15 seconds indicating an increased LE strength and improved balance. Baseline: 16.62s Goal status: INITIAL  3.  Patient will increase Berg Balance score by > 6 points to demonstrate decreased fall risk during functional activities Baseline: 11/14= 42/56 Goal status: INITIAL  4.  Patient will increase 10 meter walk test to >1.37m/s as to improve gait speed for better community ambulation and to reduce fall risk. Baseline: 13.89 (0.470m/s), 13.46 (0.429m/s) AVG: 0.441m/s Goal status: INITIAL  5.  Patient will reduce timed up and go to <11 seconds to reduce fall risk and demonstrate improved transfer/gait ability. Baseline: 21.16s without cane, 25.65s with cane Goal status: INITIAL  6.  Patient will increase FGA by > 7 points as to demonstrate reduced fall risk and improved dynamic gait balance for better safety with community/home ambulation.   Baseline: 11/14= 7/30 Goal status: REVISED   ASSESSMENT:  CLINICAL IMPRESSION: Pt arrived to session today motivated for activity and with his son. Pt showed increased speed and strength of sit<>stands today, but still needed cuing for forward lean upon stance, with pt showing backwards tendency upon stance occasionally. Patient showed significant difficulty with NBOS eyes closed static stance, needing mod-max assist for pt to remain upright. Pt showed improved gait mechanics today, as well as  good walking endurance without use of SPC. Pt tolerated red mat and half foam rollers activity well, but had 2 minor LoB, which he was able to correct. Pt needed significant time before foam step overs, and showed difficulty initiating step over with item on floor in front due to decreased ability to properly shift weight onto the supporting leg. Pt showed significantly decreased gait speed during eyes closed ambulation, needing cuing to continue to increase step length. Pt tolerates ankle weighted exercise well, stating that he does not necessarily notice the weight. Pt will continue to benefit from skilled therapy to address remaining deficits in order to improve overall QoL and return to PLOF.    OBJECTIVE IMPAIRMENTS: Abnormal gait, decreased activity tolerance, decreased balance, decreased coordination, decreased endurance, decreased mobility, difficulty walking, decreased ROM, decreased safety awareness, dizziness, hypomobility, increased fascial restrictions, impaired perceived functional ability, increased muscle spasms, impaired flexibility, impaired sensation, improper body mechanics, and postural dysfunction.   ACTIVITY LIMITATIONS: carrying, lifting, bending, squatting, stairs, transfers, bathing, toileting, dressing, hygiene/grooming, locomotion level, and caring for others  PARTICIPATION LIMITATIONS: cleaning, laundry, driving, shopping, and yard work  PERSONAL FACTORS: Age, Education, Time since onset of  injury/illness/exacerbation, and 3+ comorbidities: stroke, HTN, BPH are also affecting patient's functional outcome.   REHAB POTENTIAL: Good  CLINICAL DECISION MAKING: Evolving/moderate complexity  EVALUATION COMPLEXITY: Moderate  PLAN:  PT FREQUENCY: 2x/week  PT DURATION: 12 weeks  PLANNED INTERVENTIONS: 97164- PT Re-evaluation, 97750- Physical Performance Testing, 97110-Therapeutic exercises, 97530- Therapeutic activity, 97112- Neuromuscular re-education, 97535- Self Care,  97140- Manual therapy, [phone]- Gait training, [id number]- Aquatic Therapy, [phone]- Electrical stimulation (unattended), [phone] (1-2 muscles), 20561 (3+ muscles)- Dry Needling, Patient/Family education, Balance training, Stair training, Joint mobilization, Joint manipulation, Spinal mobilization, Vestibular training, Visual/preceptual remediation/compensation, DME instructions, Wheelchair mobility training, Cryotherapy, and Moist heat  PLAN FOR NEXT SESSION:  -Continue working to improve dynamic balance and gait with obstacles such allowing him to cross thresholds and uneven surfaces with less difficulty.  Renna Helling, SPT  10:22 AM, 06/24/2024

## 2024-06-26 ENCOUNTER — Ambulatory Visit

## 2024-06-27 ENCOUNTER — Ambulatory Visit: Admitting: Physical Therapy

## 2024-06-27 ENCOUNTER — Ambulatory Visit

## 2024-06-27 DIAGNOSIS — R2681 Unsteadiness on feet: Secondary | ICD-10-CM

## 2024-06-27 DIAGNOSIS — R2689 Other abnormalities of gait and mobility: Secondary | ICD-10-CM | POA: Diagnosis not present

## 2024-06-27 DIAGNOSIS — R262 Difficulty in walking, not elsewhere classified: Secondary | ICD-10-CM

## 2024-06-27 DIAGNOSIS — M6281 Muscle weakness (generalized): Secondary | ICD-10-CM

## 2024-06-27 NOTE — Therapy (Signed)
 " OUTPATIENT PHYSICAL THERAPY TREATMENT/Physical Therapy Progress Note   Dates of reporting period  05/20/24   to   06/27/24    Patient Name: James Herrera. MRN: [id number] DOB:[birth year], 89 y.o., male Today's Date: 06/27/2024  PCP: Sadie Manna, MD  REFERRING PROVIDER: Lane Arthea FORBES, MD  END OF SESSION:  PT End of Session - 06/27/24 0941     Visit Number 10    Number of Visits 24    Date for Recertification  08/12/24    Authorization Type Medicare & Tricare    Progress Note Due on Visit 10    PT Start Time 0936    PT Stop Time 1015    PT Time Calculation (min) 39 min    Equipment Utilized During Treatment Gait belt    Activity Tolerance Patient tolerated treatment well;No increased pain    Behavior During Therapy WFL for tasks assessed/performed           Past Medical History:  Diagnosis Date   BPH (benign prostatic hyperplasia)    Difficulty balancing    History of kidney stones    History of stroke    Hypertension    Left arm weakness    Neuropathy    Sensory ataxia    Past Surgical History:  Procedure Laterality Date   CATARACT EXTRACTION W/PHACO Right 12/31/2023   Procedure: PHACOEMULSIFICATION, CATARACT, WITH IOL  11.29 01:03.5;  Surgeon: Myrna Adine Anes, MD;  Location: [facility];  Service: Ophthalmology;  Laterality: Right;   CATARACT EXTRACTION W/PHACO Left 02/04/2024   Procedure: PHACOEMULSIFICATION, CATARACT, WITH IOL INSERTION 7.28, 00:43.6;  Surgeon: Myrna Adine Anes, MD;  Location: [facility];  Service: Ophthalmology;  Laterality: Left;   HERNIA REPAIR Right    HERNIA REPAIR Left    HERNIA REPAIR     umbilical   kidney stone removal      x2   PROSTATE BIOPSY N/A 04/22/2020   Procedure: PROSTATE BIOPSY GRAYCE;  Surgeon: Kassie Ozell SAUNDERS, MD;  Location: [facility];  Service: Urology;  Laterality: N/A;   TONSILLECTOMY     There are no active problems to display for this patient.  ONSET DATE: >6  months  REFERRING DIAG: Per recent progress note: Leg weakness and imbalance  THERAPY DIAG:  Muscle weakness (generalized)  Other abnormalities of gait and mobility  Difficulty in walking, not elsewhere classified  Unsteadiness on feet  Rationale for Evaluation and Treatment: Rehabilitation  SUBJECTIVE:  SUBJECTIVE STATEMENT: Pt reports feeling wobbly as usual but feeling a little better since beginning therapy.  PERTINENT HISTORY:  Pt reports wife suggested he return to therapy. Pt reports after last bout of therapy, he had reached his limit of improvement, but decided he wanted to try again after his balance has declined since then. Pt reports he has trouble holding things in his hand, and that grasping is difficult. Pt reports his L arm and hand have been the biggest problem for him. Pt reports he notices that when he crosses a line on the floor he has visual issues where he has to be careful to prevent LOB. Pt reports 5  falls within the last 2 years due to carelessness and dizziness. Reports his tendency when falling is backwards. Reports issues with dressing and putting pants on, and that he uses pull on pants.  PAIN:  Are you having pain? No   PRECAUTIONS: Fall  WEIGHT BEARING RESTRICTIONS: No  FALLS: Has patient fallen in last 6 months? Yes. Number of falls 5 and within last 2 years, noting mostly in the backwards direction, or when stepping over lines.  LIVING ENVIRONMENT: Lives with: lives with their spouse Lives in: House/apartment Stairs: 2+2 steps, railing and grab bars on both sides Has following equipment at home: Single point cane and Quad cane small base  PLOF: Requires assistive device for independence, Needs assistance with ADLs, Needs assistance with homemaking, Needs  assistance with gait, Needs assistance with transfers, and wife reports assisting him with most tasks, but he could do them himself if he needed to  PATIENT GOALS: Improve balance, work on improving the L UE and hand  OBJECTIVE:  Note: Objective measures were completed at Evaluation unless otherwise noted.  LOWER EXTREMITY MMT:    MMT Right Eval Left Eval  Hip flexion 5 5  Knee flexion 5 5  Knee extension 5 5  Ankle dorsiflexion 4+ 4+  (Blank rows = not tested)  UPPER EXTREMITY MMT:  MMT Right eval Left eval  Shoulder flexion 5 4  Elbow flexion 5 4+  Elbow extension 5 4+   (Blank rows = not tested)   FUNCTIONAL TESTS:  -Pt performed 5 time sit<>stand (5xSTS): 16.62 sec (>15 sec indicates increased fall risk)  -10 Meter Walk Test: Normal speed 1: 0.432 m/s Normal speed 2: 0.446 m/s Average Normal speed: 0.439 m/s  -PT instructed pt in TUG: 21.16s without cane, 25.65s with cane sec (average of 3 trials; >13.5 sec indicates increased fall risk)   PATIENT SURVEYS:  The Activities-Specific Balance Confidence (ABC) Scale: 35.6% 05/23/24                                                                                                                              TREATMENT DATE 06/27/2024:   ABC scale: The Activities-Specific Balance Confidence (ABC) Scale  No confidence<->completely confident     How confident are you that you will not lose your balance  or become unsteady when you . . .       Date tested    1: Walk around the house 60   2. Walk up or down stairs 60   3. Bend over and pick up a slipper from in front of a closet floor 50   4. Reach for a small can off a shelf at eye level 30   5. Stand on tip toes and reach for something above your head 10   6. Stand on a chair and reach for something 10   7. Sweep the floor 20   8. Walk outside the house to a car parked in the driveway 80   9. Get into or out of a car 50   10. Walk across a parking lot to the mall 20    11. Walk up or down a ramp 30   12. Walk in a crowded mall where people rapidly walk past you 30   13. Are bumped into by people as you walk through the mall 10   14. Step onto or off of an escalator while you are holding onto the railing 20   15. Step onto or off an escalator while holding onto parcels such that you cannot hold onto the railing 10   16. Walk outside on icy sidewalks 0   Total: #/16 490 0.30625    30.625     Physical Performance Test or Measurement: a  physical performance test(s) or measurement (eg,  musculoskeletal, functional capacity), with written report,  each 15 mins   Five times Sit to Stand Test (FTSS)  TIME:  16.62s :12/19:17.24 sec with UE, 37.42 sec without UE assist    Cut off scores indicative of increased fall risk: >12 sec CVA, >16 sec PD, >13 sec vestibular (ANPTA Core Set of Outcome Measures for Adults with Neurologic Conditions, 2018)   10 Meter Walk Test: Patient instructed to walk 10 meters (32.8 ft) as quickly and as safely as possible at their normal speed Results: .97 m/s (10.29 seconds no Ad)  Cut off scores:   Household Ambulator  < 0.4 m/s  Limited Community Ambulator  0.4 - 0.8 m/s  [employer]  > 0.8 m/s  Increased fall risk  < 1.5m/s  Crossing a Street  >1.13m/s  MCID 0.05 m/s (small), 0.13 m/s (moderate), 0.06 m/s (significant)  (ANPTA Core Set of Outcome Measures for Adults with Neurologic Conditions, 2018)      PT instructed pt in TUG: 16.69 sec ( >13.5 sec indicates increased fall risk)  FGA : 14 Pt participated in Functional Gait Assessment (FGA) with score of 14/30 demonstrating high fall risk (low fall risk 25-28, medium fall risk 19-24, and high fall risk <19).    [facility] PT Assessment - 06/27/24 0001       Berg Balance Test   Sit to Stand Able to stand without using hands and stabilize independently    Standing Unsupported Able to stand safely 2 minutes    Sitting with Back Unsupported but Feet  Supported on Floor or Stool Able to sit safely and securely 2 minutes    Stand to Sit Sits safely with minimal use of hands    Transfers Able to transfer safely, minor use of hands    Standing Unsupported with Eyes Closed Able to stand 10 seconds safely    Standing Unsupported with Feet Together Able to place feet together independently and stand 1 minute safely    From Standing, Reach  Forward with Outstretched Arm Can reach confidently >25 cm (10)    From Standing Position, Pick up Object from Floor Able to pick up shoe, needs supervision    From Standing Position, Turn to Look Behind Over each Shoulder Looks behind from both sides and weight shifts well    Turn 360 Degrees Able to turn 360 degrees safely but slowly    Standing Unsupported, Alternately Place Feet on Step/Stool Able to stand independently and safely and complete 8 steps in 20 seconds    Standing Unsupported, One Foot in Front Able to plae foot ahead of the other independently and hold 30 seconds    Standing on One Leg Tries to lift leg/unable to hold 3 seconds but remains standing independently    Total Score 49      Functional Gait  Assessment   Gait assessed  Yes    Gait Level Surface Walks 20 ft in less than 7 sec but greater than 5.5 sec, uses assistive device, slower speed, mild gait deviations, or deviates 6-10 in outside of the 12 in walkway width.    Change in Gait Speed Able to change speed, demonstrates mild gait deviations, deviates 6-10 in outside of the 12 in walkway width, or no gait deviations, unable to achieve a major change in velocity, or uses a change in velocity, or uses an assistive device.    Gait with Horizontal Head Turns Performs head turns smoothly with slight change in gait velocity (eg, minor disruption to smooth gait path), deviates 6-10 in outside 12 in walkway width, or uses an assistive device.    Gait with Vertical Head Turns Performs task with slight change in gait velocity (eg, minor disruption  to smooth gait path), deviates 6 - 10 in outside 12 in walkway width or uses assistive device    Gait and Pivot Turn Pivot turns safely in greater than 3 sec and stops with no loss of balance, or pivot turns safely within 3 sec and stops with mild imbalance, requires small steps to catch balance.    Step Over Obstacle Cannot perform without assistance.    Gait with Narrow Base of Support Ambulates less than 4 steps heel to toe or cannot perform without assistance.    Gait with Eyes Closed Walks 20 ft, slow speed, abnormal gait pattern, evidence for imbalance, deviates 10-15 in outside 12 in walkway width. Requires more than 9 sec to ambulate 20 ft.    Ambulating Backwards Walks 20 ft, uses assistive device, slower speed, mild gait deviations, deviates 6-10 in outside 12 in walkway width.    Steps Two feet to a stair, must use rail.    Total Score 14          PATIENT EDUCATION: Education details: Pt educated on possibility of benefit on OT for L UE and hand therapy, educated on obtaining referral from MD for OT, interpretation of outcome measures Person educated: Patient and Spouse Education method: Explanation Education comprehension: verbalized understanding  HOME EXERCISE PROGRAM: Provide next session.  GOALS: Goals reviewed with patient? No SHORT TERM GOALS: Target date: 06/17/2024   Patient will be independent in home exercise program to improve strength/mobility for better functional independence with ADLs. Baseline: 12/19: been doing 2 x per week maybe  Goal status: INITIAL  LONG TERM GOALS: Target date: 08/12/2024   Patient will increase ABC scale score >80% to demonstrate better functional mobility and better confidence with ADLs.  Baseline: 35.6% 12/19:30.6% Goal status: INITIAL  2.  Patient (>  7 years old) will complete five times sit to stand test in < 15 seconds indicating an increased LE strength and improved balance. Baseline: 16.62s :12/19:17.24 sec with UE, 37.42  sec without UE assist   Goal status: IN PROGRESS  3.  Patient will increase Berg Balance score by > 6 points to demonstrate decreased fall risk during functional activities Baseline: 11/14= 42/56 12/19: 49  Goal status: MET  4.  Patient will increase 10 meter walk test to >1.53m/s as to improve gait speed for better community ambulation and to reduce fall risk. Baseline: 13.89 (0.440m/s), 13.46 (0.426m/s) AVG: 0.495m/s 13/19:10.29 sec (.97ms) Goal status: IN PROGRESS  5.  Patient will reduce timed up and go to <11 seconds to reduce fall risk and demonstrate improved transfer/gait ability. Baseline: 21.16s without cane, 25.65s with cane 12/19: 16.69 sec  Goal status: INITIAL  6.  Patient will increase FGA by > 7 points as to demonstrate reduced fall risk and improved dynamic gait balance for better safety with community/home ambulation.   Baseline: 11/14= 7/30 12/19: 14 Goal status: ONGOING  ASSESSMENT:  CLINICAL IMPRESSION:  Patient arrived with good motivation for completion of pt activities. Pt presents for progress note this date. Pt shows improvement with activity across all measures tested today showing positive response and improvement in line with his POC. Pt shows improved BERG balance test and FGA showing improved balance, decreased risk of falls and improved adaptability with various tasks. Pt also shows improved gait speed and TUG showing improved capacity for functional mobility and decreased fall risk. Patient's condition has the potential to improve in response to therapy. Maximum improvement is yet to be obtained. The anticipated improvement is attainable and reasonable in a generally predictable time.  Pt will continue to benefit from skilled physical therapy intervention to address impairments, improve QOL, and attain therapy goals.    OBJECTIVE IMPAIRMENTS: Abnormal gait, decreased activity tolerance, decreased balance, decreased coordination, decreased endurance,  decreased mobility, difficulty walking, decreased ROM, decreased safety awareness, dizziness, hypomobility, increased fascial restrictions, impaired perceived functional ability, increased muscle spasms, impaired flexibility, impaired sensation, improper body mechanics, and postural dysfunction.   ACTIVITY LIMITATIONS: carrying, lifting, bending, squatting, stairs, transfers, bathing, toileting, dressing, hygiene/grooming, locomotion level, and caring for others  PARTICIPATION LIMITATIONS: cleaning, laundry, driving, shopping, and yard work  PERSONAL FACTORS: Age, Education, Time since onset of injury/illness/exacerbation, and 3+ comorbidities: stroke, HTN, BPH are also affecting patient's functional outcome.   REHAB POTENTIAL: Good  CLINICAL DECISION MAKING: Evolving/moderate complexity  EVALUATION COMPLEXITY: Moderate  PLAN:  PT FREQUENCY: 2x/week  PT DURATION: 12 weeks  PLANNED INTERVENTIONS: 97164- PT Re-evaluation, 97750- Physical Performance Testing, 97110-Therapeutic exercises, 97530- Therapeutic activity, 97112- Neuromuscular re-education, 97535- Self Care, [id number]- Manual therapy, [phone]- Gait training, [phone]- Aquatic Therapy, [phone]- Electrical stimulation (unattended), [phone] (1-2 muscles), [phone] (3+ muscles)- Dry Needling, Patient/Family education, Balance training, Stair training, Joint mobilization, Joint manipulation, Spinal mobilization, Vestibular training, Visual/preceptual remediation/compensation, DME instructions, Wheelchair mobility training, Cryotherapy, and Moist heat  PLAN FOR NEXT SESSION:  -Continue working to improve dynamic balance and gait with obstacles such allowing him to cross thresholds and uneven surfaces with less difficulty.  Note: Portions of this document were prepared using Dragon voice recognition software and although reviewed may contain unintentional dictation errors in syntax, grammar, or spelling.  Lonni KATHEE Gainer PT ,DPT Physical Therapist-  [place]  [facility]        "

## 2024-06-30 ENCOUNTER — Ambulatory Visit

## 2024-06-30 DIAGNOSIS — R2689 Other abnormalities of gait and mobility: Secondary | ICD-10-CM | POA: Diagnosis not present

## 2024-06-30 NOTE — Therapy (Signed)
 " OUTPATIENT PHYSICAL THERAPY TREATMENT    Patient Name: James Herrera. MRN: [id number] DOB:14-Mar-1934, 89 y.o., male Today's Date: 06/30/2024  PCP: Sadie Manna, MD  REFERRING PROVIDER: Lane Arthea FORBES, MD  END OF SESSION:  PT End of Session - 06/30/24 1015     Visit Number 11    Number of Visits 24    Date for Recertification  08/12/24    Authorization Type Medicare & Tricare    Progress Note Due on Visit 10    PT Start Time 1015    PT Stop Time 1056    PT Time Calculation (min) 41 min    Equipment Utilized During Treatment Gait belt    Activity Tolerance Patient tolerated treatment well;No increased pain    Behavior During Therapy WFL for tasks assessed/performed            Past Medical History:  Diagnosis Date   BPH (benign prostatic hyperplasia)    Difficulty balancing    History of kidney stones    History of stroke    Hypertension    Left arm weakness    Neuropathy    Sensory ataxia    Past Surgical History:  Procedure Laterality Date   CATARACT EXTRACTION W/PHACO Right 12/31/2023   Procedure: PHACOEMULSIFICATION, CATARACT, WITH IOL  11.29 01:03.5;  Surgeon: Myrna Adine Anes, MD;  Location: [facility];  Service: Ophthalmology;  Laterality: Right;   CATARACT EXTRACTION W/PHACO Left 02/04/2024   Procedure: PHACOEMULSIFICATION, CATARACT, WITH IOL INSERTION 7.28, 00:43.6;  Surgeon: Myrna Adine Anes, MD;  Location: [facility];  Service: Ophthalmology;  Laterality: Left;   HERNIA REPAIR Right    HERNIA REPAIR Left    HERNIA REPAIR     umbilical   kidney stone removal      x2   PROSTATE BIOPSY N/A 04/22/2020   Procedure: PROSTATE BIOPSY GRAYCE;  Surgeon: Kassie Ozell SAUNDERS, MD;  Location: [facility];  Service: Urology;  Laterality: N/A;   TONSILLECTOMY     There are no active problems to display for this patient.  ONSET DATE: >6 months  REFERRING DIAG: Per recent progress note: Leg weakness and imbalance  THERAPY DIAG:   Muscle weakness (generalized)  Other lack of coordination  Other abnormalities of gait and mobility  Difficulty in walking, not elsewhere classified  Unsteadiness on feet  Rationale for Evaluation and Treatment: Rehabilitation  SUBJECTIVE:                                                                                                                                                                                             SUBJECTIVE STATEMENT:  Patient reports he had skin cancer removed from his L forearm on 12/17. He currently has it wrapped due to stitches still being present.   PERTINENT HISTORY:  Pt reports wife suggested he return to therapy. Pt reports after last bout of therapy, he had reached his limit of improvement, but decided he wanted to try again after his balance has declined since then. Pt reports he has trouble holding things in his hand, and that grasping is difficult. Pt reports his L arm and hand have been the biggest problem for him. Pt reports he notices that when he crosses a line on the floor he has visual issues where he has to be careful to prevent LOB. Pt reports 5  falls within the last 2 years due to carelessness and dizziness. Reports his tendency when falling is backwards. Reports issues with dressing and putting pants on, and that he uses pull on pants.  PAIN:  Are you having pain? No   PRECAUTIONS: Fall  WEIGHT BEARING RESTRICTIONS: No  FALLS: Has patient fallen in last 6 months? Yes. Number of falls 5 and within last 2 years, noting mostly in the backwards direction, or when stepping over lines.  LIVING ENVIRONMENT: Lives with: lives with their spouse Lives in: House/apartment Stairs: 2+2 steps, railing and grab bars on both sides Has following equipment at home: Single point cane and Quad cane small base  PLOF: Requires assistive device for independence, Needs assistance with ADLs, Needs assistance with homemaking, Needs assistance with  gait, Needs assistance with transfers, and wife reports assisting him with most tasks, but he could do them himself if he needed to  PATIENT GOALS: Improve balance, work on improving the L UE and hand  OBJECTIVE:  Note: Objective measures were completed at Evaluation unless otherwise noted.  LOWER EXTREMITY MMT:    MMT Right Eval Left Eval  Hip flexion 5 5  Knee flexion 5 5  Knee extension 5 5  Ankle dorsiflexion 4+ 4+  (Blank rows = not tested)  UPPER EXTREMITY MMT:  MMT Right eval Left eval  Shoulder flexion 5 4  Elbow flexion 5 4+  Elbow extension 5 4+   (Blank rows = not tested)   FUNCTIONAL TESTS:  -Pt performed 5 time sit<>stand (5xSTS): 16.62 sec (>15 sec indicates increased fall risk)  -10 Meter Walk Test: Normal speed 1: 0.432 m/s Normal speed 2: 0.446 m/s Average Normal speed: 0.439 m/s  -PT instructed pt in TUG: 21.16s without cane, 25.65s with cane sec (average of 3 trials; >13.5 sec indicates increased fall risk)   PATIENT SURVEYS:  The Activities-Specific Balance Confidence (ABC) Scale: 35.6% 05/23/24                                                                                                                              TREATMENT DATE 06/30/2024:   Sit<>stand from chair, no UE support, 3x10 31ft ambulation, using no SPC, CGA assist only Standing on airex  pad with alternating step taps x 10 each LE with no UE support  Ambulation 450' with 2# AW with no SPC, CGA assist only  Fwd/bwd step over half bolster x 10 leading with each LE - single UE support on R - 2# AW bilaterally  Lateral step over half bolster x 10 L/R with single UE support - 2# AW bilaterally  PATIENT EDUCATION: Education details: Pt educated on possibility of benefit on OT for L UE and hand therapy, educated on obtaining referral from MD for OT, interpretation of outcome measures Person educated: Patient and Spouse Education method: Explanation Education comprehension:  verbalized understanding  HOME EXERCISE PROGRAM: Provide next session.  GOALS: Goals reviewed with patient? No SHORT TERM GOALS: Target date: 06/17/2024   Patient will be independent in home exercise program to improve strength/mobility for better functional independence with ADLs. Baseline: 12/19: been doing 2 x per week maybe  Goal status: INITIAL  LONG TERM GOALS: Target date: 08/12/2024   Patient will increase ABC scale score >80% to demonstrate better functional mobility and better confidence with ADLs.  Baseline: 35.6% 12/19:30.6% Goal status: INITIAL  2.  Patient (> 35 years old) will complete five times sit to stand test in < 15 seconds indicating an increased LE strength and improved balance. Baseline: 16.62s :12/19:17.24 sec with UE, 37.42 sec without UE assist   Goal status: IN PROGRESS  3.  Patient will increase Berg Balance score by > 6 points to demonstrate decreased fall risk during functional activities Baseline: 11/14= 42/56 12/19: 49  Goal status: MET  4.  Patient will increase 10 meter walk test to >1.31m/s as to improve gait speed for better community ambulation and to reduce fall risk. Baseline: 13.89 (0.451m/s), 13.46 (0.422m/s) AVG: 0.429m/s 13/19:10.29 sec (.97ms) Goal status: IN PROGRESS  5.  Patient will reduce timed up and go to <11 seconds to reduce fall risk and demonstrate improved transfer/gait ability. Baseline: 21.16s without cane, 25.65s with cane 12/19: 16.69 sec  Goal status: INITIAL  6.  Patient will increase FGA by > 7 points as to demonstrate reduced fall risk and improved dynamic gait balance for better safety with community/home ambulation.   Baseline: 11/14= 7/30 12/19: 14 Goal status: ONGOING  ASSESSMENT:  CLINICAL IMPRESSION:   Continued PT POC focused on balance and BLE strengthening. Session focused on endurance and dynamic standing balance on stable and unstable surfaces. Tolerated session well with appropriate fatigue and  requested seated rest breaks. Pt will continue to benefit from skilled physical therapy intervention to address impairments, improve QOL, and attain therapy goals.   OBJECTIVE IMPAIRMENTS: Abnormal gait, decreased activity tolerance, decreased balance, decreased coordination, decreased endurance, decreased mobility, difficulty walking, decreased ROM, decreased safety awareness, dizziness, hypomobility, increased fascial restrictions, impaired perceived functional ability, increased muscle spasms, impaired flexibility, impaired sensation, improper body mechanics, and postural dysfunction.   ACTIVITY LIMITATIONS: carrying, lifting, bending, squatting, stairs, transfers, bathing, toileting, dressing, hygiene/grooming, locomotion level, and caring for others  PARTICIPATION LIMITATIONS: cleaning, laundry, driving, shopping, and yard work  PERSONAL FACTORS: Age, Education, Time since onset of injury/illness/exacerbation, and 3+ comorbidities: stroke, HTN, BPH are also affecting patient's functional outcome.   REHAB POTENTIAL: Good  CLINICAL DECISION MAKING: Evolving/moderate complexity  EVALUATION COMPLEXITY: Moderate  PLAN:  PT FREQUENCY: 2x/week  PT DURATION: 12 weeks  PLANNED INTERVENTIONS: 97164- PT Re-evaluation, 97750- Physical Performance Testing, 97110-Therapeutic exercises, 97530- Therapeutic activity, [id number]- Neuromuscular re-education, 97535- Self Care, [id number]- Manual therapy, [id number]- Gait training, [phone]- Aquatic Therapy, [phone]- Electrical stimulation (unattended),  [id number] (1-2 muscles), [phone] (3+ muscles)- Dry Needling, Patient/Family education, Balance training, Stair training, Joint mobilization, Joint manipulation, Spinal mobilization, Vestibular training, Visual/preceptual remediation/compensation, DME instructions, Wheelchair mobility training, Cryotherapy, and Moist heat  PLAN FOR NEXT SESSION:  -Continue working to improve dynamic balance and gait with obstacles such allowing him to  cross thresholds and uneven surfaces with less difficulty.   Maryanne Finder, PT, DPT Physical Therapist - [place]  [facility]    "

## 2024-07-08 ENCOUNTER — Ambulatory Visit: Admitting: Physical Therapy

## 2024-07-08 ENCOUNTER — Ambulatory Visit: Admitting: Occupational Therapy

## 2024-07-08 DIAGNOSIS — R278 Other lack of coordination: Secondary | ICD-10-CM

## 2024-07-08 DIAGNOSIS — M6281 Muscle weakness (generalized): Secondary | ICD-10-CM

## 2024-07-08 DIAGNOSIS — R262 Difficulty in walking, not elsewhere classified: Secondary | ICD-10-CM

## 2024-07-08 DIAGNOSIS — R2689 Other abnormalities of gait and mobility: Secondary | ICD-10-CM

## 2024-07-08 DIAGNOSIS — R2681 Unsteadiness on feet: Secondary | ICD-10-CM

## 2024-07-08 NOTE — Therapy (Signed)
 [facility] at [facility] [address] [place], [place], [address] Phone: [phone]   Fax:  [phone]  Patient Details  Name: James Herrera. MRN: [id number] Date of Birth: 15-Apr-1934 Referring Provider:  Sadie Manna, MD  Encounter Date: 07/08/2024  Pt. arrived for his therapy appointments this morning. Pt. had to leave following PT due to a conflicting scheduled appointment. As a result, Pt. Was unable to attend the OT treatment session.   Richardson Otter, MS, OTR/L  07/08/2024, 5:18 PM  [place] [facility] at [facility] [address] [place], [place], [address] Phone: [phone]   Fax:  [phone]

## 2024-07-08 NOTE — Therapy (Signed)
 " OUTPATIENT PHYSICAL THERAPY TREATMENT    Patient Name: James Herrera. MRN: [id number] DOB:02/17/1934, 89 y.o., male Today's Date: 07/08/2024  PCP: Sadie Manna, MD  REFERRING PROVIDER: Lane Arthea FORBES, MD  END OF SESSION:  PT End of Session - 07/08/24 1014     Visit Number 12    Number of Visits 24    Date for Recertification  08/12/24    Authorization Type Medicare & Tricare    Progress Note Due on Visit 10    PT Start Time 1015    PT Stop Time 1055    PT Time Calculation (min) 40 min    Equipment Utilized During Treatment Gait belt    Activity Tolerance Patient tolerated treatment well;No increased pain    Behavior During Therapy WFL for tasks assessed/performed            Past Medical History:  Diagnosis Date   BPH (benign prostatic hyperplasia)    Difficulty balancing    History of kidney stones    History of stroke    Hypertension    Left arm weakness    Neuropathy    Sensory ataxia    Past Surgical History:  Procedure Laterality Date   CATARACT EXTRACTION W/PHACO Right 12/31/2023   Procedure: PHACOEMULSIFICATION, CATARACT, WITH IOL  11.29 01:03.5;  Surgeon: Myrna Adine Anes, MD;  Location: [facility];  Service: Ophthalmology;  Laterality: Right;   CATARACT EXTRACTION W/PHACO Left 02/04/2024   Procedure: PHACOEMULSIFICATION, CATARACT, WITH IOL INSERTION 7.28, 00:43.6;  Surgeon: Myrna Adine Anes, MD;  Location: [facility];  Service: Ophthalmology;  Laterality: Left;   HERNIA REPAIR Right    HERNIA REPAIR Left    HERNIA REPAIR     umbilical   kidney stone removal      x2   PROSTATE BIOPSY N/A 04/22/2020   Procedure: PROSTATE BIOPSY GRAYCE;  Surgeon: Kassie Ozell SAUNDERS, MD;  Location: [facility];  Service: Urology;  Laterality: N/A;   TONSILLECTOMY     There are no active problems to display for this patient.  ONSET DATE: >6 months  REFERRING DIAG: Per recent progress note: Leg weakness and imbalance  THERAPY DIAG:   Muscle weakness (generalized)  Other lack of coordination  Other abnormalities of gait and mobility  Difficulty in walking, not elsewhere classified  Unsteadiness on feet  Rationale for Evaluation and Treatment: Rehabilitation  SUBJECTIVE:                                                                                                                                                                                             SUBJECTIVE STATEMENT:  Pt reports that he had  a different Christmas because he was unable to get in the attic this year to get the decorations. Family came in town from [place] . No pain reported by pt. No falls reported by pt since last PT treatment.   PERTINENT HISTORY:   Pt reports wife suggested he return to therapy. Pt reports after last bout of therapy, he had reached his limit of improvement, but decided he wanted to try again after his balance has declined since then. Pt reports he has trouble holding things in his hand, and that grasping is difficult. Pt reports his L arm and hand have been the biggest problem for him. Pt reports he notices that when he crosses a line on the floor he has visual issues where he has to be careful to prevent LOB. Pt reports 5  falls within the last 2 years due to carelessness and dizziness. Reports his tendency when falling is backwards. Reports issues with dressing and putting pants on, and that he uses pull on pants.  PAIN:  Are you having pain? No   PRECAUTIONS: Fall  WEIGHT BEARING RESTRICTIONS: No  FALLS: Has patient fallen in last 6 months? Yes. Number of falls 5 and within last 2 years, noting mostly in the backwards direction, or when stepping over lines.  LIVING ENVIRONMENT: Lives with: lives with their spouse Lives in: House/apartment Stairs: 2+2 steps, railing and grab bars on both sides Has following equipment at home: Single point cane and Quad cane small base  PLOF: Requires assistive device for  independence, Needs assistance with ADLs, Needs assistance with homemaking, Needs assistance with gait, Needs assistance with transfers, and wife reports assisting him with most tasks, but he could do them himself if he needed to  PATIENT GOALS: Improve balance, work on improving the L UE and hand  OBJECTIVE:  Note: Objective measures were completed at Evaluation unless otherwise noted.  LOWER EXTREMITY MMT:    MMT Right Eval Left Eval  Hip flexion 5 5  Knee flexion 5 5  Knee extension 5 5  Ankle dorsiflexion 4+ 4+  (Blank rows = not tested)  UPPER EXTREMITY MMT:  MMT Right eval Left eval  Shoulder flexion 5 4  Elbow flexion 5 4+  Elbow extension 5 4+   (Blank rows = not tested)   FUNCTIONAL TESTS:  -Pt performed 5 time sit<>stand (5xSTS): 16.62 sec (>15 sec indicates increased fall risk)  -10 Meter Walk Test: Normal speed 1: 0.432 m/s Normal speed 2: 0.446 m/s Average Normal speed: 0.439 m/s  -PT instructed pt in TUG: 21.16s without cane, 25.65s with cane sec (average of 3 trials; >13.5 sec indicates increased fall risk)   PATIENT SURVEYS:  The Activities-Specific Balance Confidence (ABC) Scale: 35.6% 05/23/24                                                                                                                              TREATMENT DATE  07/08/2024:  - Nustep reciprocal movement training and neurologic priming x 5 min  -Weighted gait without UE support and 2# AW x 369ft, 3#AW 2 x 330ft  pt rates 2# resistance as easy on this day. And 3# resistance as moderate to hard with fatigue. - Step up to 6inch step with UE support on rail with 3# AW.   - Side stepping up/down 6inch step with 3# AW x 4 bil UE support from the RUE.  -forward/reverse gait without UE support 37ft x 4 with 3# AW.  -ambulatory transfer to transport chair [facility] and supervision assist from PT. Pt and family member, report that he needed to leave prior to OT treatment. OT notified.    CGA  provided by PT for safety unless otherwise noted.   PATIENT EDUCATION: Education details: Pt educated on possibility of benefit on OT for L UE and hand therapy, educated on obtaining referral from MD for OT, interpretation of outcome measures Person educated: Patient and Spouse Education method: Explanation Education comprehension: verbalized understanding  HOME EXERCISE PROGRAM: Provide next session.  GOALS: Goals reviewed with patient? No SHORT TERM GOALS: Target date: 06/17/2024   Patient will be independent in home exercise program to improve strength/mobility for better functional independence with ADLs. Baseline: 12/19: been doing 2 x per week maybe  Goal status: INITIAL  LONG TERM GOALS: Target date: 08/12/2024   Patient will increase ABC scale score >80% to demonstrate better functional mobility and better confidence with ADLs.  Baseline: 35.6% 12/19:30.6% Goal status: INITIAL  2.  Patient (> 48 years old) will complete five times sit to stand test in < 15 seconds indicating an increased LE strength and improved balance. Baseline: 16.62s :12/19:17.24 sec with UE, 37.42 sec without UE assist   Goal status: IN PROGRESS  3.  Patient will increase Berg Balance score by > 6 points to demonstrate decreased fall risk during functional activities Baseline: 11/14= 42/56 12/19: 49  Goal status: MET  4.  Patient will increase 10 meter walk test to >1.48m/s as to improve gait speed for better community ambulation and to reduce fall risk. Baseline: 13.89 (0.497m/s), 13.46 (0.431m/s) AVG: 0.418m/s 13/19:10.29 sec (.97ms) Goal status: IN PROGRESS  5.  Patient will reduce timed up and go to <11 seconds to reduce fall risk and demonstrate improved transfer/gait ability. Baseline: 21.16s without cane, 25.65s with cane 12/19: 16.69 sec  Goal status: INITIAL  6.  Patient will increase FGA by > 7 points as to demonstrate reduced fall risk and improved dynamic gait balance for better safety  with community/home ambulation.   Baseline: 11/14= 7/30 12/19: 14 Goal status: ONGOING  ASSESSMENT:  CLINICAL IMPRESSION:   Continued PT POC focused on balance and BLE strengthening. Session focused on endurance in weighted position with multiple bouts of weighted gait training.  Noted to have increased difficulty with lateral step ups due to gluteal weakness, but was able to complete without increased pain. Also demonstrated difficulty with full step length in reverse. States that he will be missing a few sessions in the new year due to skin cancer removal. Session well with appropriate fatigue and requested seated rest breaks. Pt will continue to benefit from skilled physical therapy intervention to address impairments, improve QOL, and attain therapy goals.   OBJECTIVE IMPAIRMENTS: Abnormal gait, decreased activity tolerance, decreased balance, decreased coordination, decreased endurance, decreased mobility, difficulty walking, decreased ROM, decreased safety awareness, dizziness, hypomobility, increased fascial restrictions, impaired perceived functional ability, increased muscle spasms, impaired flexibility, impaired sensation,  improper body mechanics, and postural dysfunction.   ACTIVITY LIMITATIONS: carrying, lifting, bending, squatting, stairs, transfers, bathing, toileting, dressing, hygiene/grooming, locomotion level, and caring for others  PARTICIPATION LIMITATIONS: cleaning, laundry, driving, shopping, and yard work  PERSONAL FACTORS: Age, Education, Time since onset of injury/illness/exacerbation, and 3+ comorbidities: stroke, HTN, BPH are also affecting patient's functional outcome.   REHAB POTENTIAL: Good  CLINICAL DECISION MAKING: Evolving/moderate complexity  EVALUATION COMPLEXITY: Moderate  PLAN:  PT FREQUENCY: 2x/week  PT DURATION: 12 weeks  PLANNED INTERVENTIONS: 97164- PT Re-evaluation, 97750- Physical Performance Testing, 97110-Therapeutic exercises, 97530-  Therapeutic activity, 97112- Neuromuscular re-education, 97535- Self Care, 02859- Manual therapy, [phone]- Gait training, [phone]- Aquatic Therapy, [phone]- Electrical stimulation (unattended), [phone] (1-2 muscles), [phone] (3+ muscles)- Dry Needling, Patient/Family education, Balance training, Stair training, Joint mobilization, Joint manipulation, Spinal mobilization, Vestibular training, Visual/preceptual remediation/compensation, DME instructions, Wheelchair mobility training, Cryotherapy, and Moist heat  PLAN FOR NEXT SESSION:   -Continue working to improve dynamic balance and gait with obstacles such allowing him to cross thresholds and uneven surfaces with less difficulty.     Massie Dollar PT, DPT  Physical Therapist - [place]  [facility]  10:15 AM 07/08/2024     "

## 2024-07-11 ENCOUNTER — Ambulatory Visit: Payer: Self-pay | Admitting: Urology

## 2024-07-15 ENCOUNTER — Ambulatory Visit: Admitting: Physical Therapy

## 2024-07-15 ENCOUNTER — Ambulatory Visit: Admitting: Occupational Therapy

## 2024-07-16 ENCOUNTER — Ambulatory Visit: Admitting: Urology

## 2024-07-17 ENCOUNTER — Ambulatory Visit

## 2024-07-17 ENCOUNTER — Ambulatory Visit: Admitting: Occupational Therapy

## 2024-07-22 ENCOUNTER — Ambulatory Visit: Admitting: Physical Therapy

## 2024-07-24 ENCOUNTER — Ambulatory Visit

## 2024-07-24 ENCOUNTER — Ambulatory Visit: Admitting: Physical Therapy

## 2024-07-25 NOTE — Progress Notes (Signed)
 Date of Surgery: 07/25/2024  Pre-op Diagnosis:  Melanoma left shoulder  Post-op Diagnosis: Same  Performing Service: Surgical Oncology  Attending Surgeon:  Nanci Sharper  Assist: None  Procedure:  1.  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  Brought to the procedure room for definitive excision  Findings:    The primary lesion was excised with at least 1 cm margins.  It was closed primarily.    Procedure in Detail: The patient was taken to the procedure room and placed in the supine position on the operating table, with a bump under his left shoulder.  The lesion was on the lateral aspect of the shoulder just posterior to the anterior spine of the trapezius.  The patient was prepped and draped in sterile fashion. An appropriate timeout was performed.  We drew out at least 1 cm margins around the primary, orienting this from medial to lateral. Local anesthetic was used to anesthetize the surgical site. The lesion was  excised on the marked out margins, taken down through the subcutaneous fat to the muscular fascia.  It was oriented for the pathologists and sent for permanent section. Hemostasis was assured.  This was a 6.5 x 3.5 cm area.  It was closed in layers with absorbable sutures and dressed appropriately.   At the completion of the procedure, all lap, sponge and instrument counts were correct. Blood loss was minimal.   Teaching Surgeon Attestation: I was present and scrubbed.    Wide Local Excision for Primary Cutaneous Melanoma - Excision 1 (Shoulder - Left) Operation performed with curative intent Yes  Original Breslow thickness of the lesion 1.9 mm (to the tenth of a millimeter)  Clinical margin width 1 cm  Depth of excision Full-thickness skin/subcutaneous tissue down to fascia (melanoma)

## 2024-07-29 ENCOUNTER — Ambulatory Visit

## 2024-07-29 ENCOUNTER — Ambulatory Visit: Admitting: Physical Therapy

## 2024-07-31 ENCOUNTER — Ambulatory Visit

## 2024-08-05 ENCOUNTER — Ambulatory Visit: Admitting: Occupational Therapy

## 2024-08-05 ENCOUNTER — Ambulatory Visit

## 2024-08-07 ENCOUNTER — Ambulatory Visit: Admitting: Occupational Therapy

## 2024-08-07 ENCOUNTER — Ambulatory Visit

## 2024-08-12 ENCOUNTER — Ambulatory Visit

## 2024-08-12 ENCOUNTER — Telehealth: Payer: Self-pay

## 2024-08-14 ENCOUNTER — Ambulatory Visit

## 2024-08-19 ENCOUNTER — Ambulatory Visit: Admitting: Physical Therapy

## 2024-08-19 ENCOUNTER — Ambulatory Visit

## 2024-08-20 ENCOUNTER — Ambulatory Visit: Admitting: Urology

## 2024-08-21 ENCOUNTER — Ambulatory Visit: Admitting: Occupational Therapy

## 2024-08-21 ENCOUNTER — Ambulatory Visit

## 2024-08-26 ENCOUNTER — Ambulatory Visit: Admitting: Physical Therapy

## 2024-08-26 ENCOUNTER — Ambulatory Visit

## 2024-08-28 ENCOUNTER — Ambulatory Visit

## 2024-08-28 ENCOUNTER — Ambulatory Visit: Admitting: Physical Therapy

## 2024-09-02 ENCOUNTER — Ambulatory Visit: Admitting: Physical Therapy

## 2024-09-02 ENCOUNTER — Ambulatory Visit

## 2024-09-04 ENCOUNTER — Ambulatory Visit

## 2024-09-09 ENCOUNTER — Ambulatory Visit: Admitting: Physical Therapy

## 2024-09-09 ENCOUNTER — Ambulatory Visit

## 2024-09-11 ENCOUNTER — Ambulatory Visit

## 2024-09-16 ENCOUNTER — Ambulatory Visit

## 2024-09-16 ENCOUNTER — Ambulatory Visit: Admitting: Physical Therapy

## 2024-09-18 ENCOUNTER — Ambulatory Visit

## 2024-09-23 ENCOUNTER — Ambulatory Visit
